# Patient Record
Sex: Male | Born: 1970 | Race: White | Hispanic: No | Marital: Married | Smoking: Former smoker
Health system: Southern US, Community
[De-identification: ages and names within clinical notes are randomized; demographics above are authoritative.]

## PROBLEM LIST (undated history)

## (undated) DIAGNOSIS — F419 Anxiety disorder, unspecified: Secondary | ICD-10-CM

## (undated) DIAGNOSIS — R51 Headache: Secondary | ICD-10-CM

## (undated) DIAGNOSIS — I10 Essential (primary) hypertension: Secondary | ICD-10-CM

---

## 2012-10-09 ENCOUNTER — Emergency Department (HOSPITAL_COMMUNITY): Payer: BC Managed Care – PPO

## 2012-10-09 ENCOUNTER — Encounter (HOSPITAL_COMMUNITY): Payer: Self-pay | Admitting: Cardiology

## 2012-10-09 ENCOUNTER — Emergency Department (HOSPITAL_COMMUNITY)
Admission: EM | Admit: 2012-10-09 | Discharge: 2012-10-09 | Disposition: A | Discharge status: Home / Self Care | Payer: BC Managed Care – PPO | Attending: Emergency Medicine | Admitting: Emergency Medicine

## 2012-10-09 DIAGNOSIS — E041 Nontoxic single thyroid nodule: Secondary | ICD-10-CM | POA: Insufficient documentation

## 2012-10-09 DIAGNOSIS — M545 Low back pain, unspecified: Secondary | ICD-10-CM | POA: Insufficient documentation

## 2012-10-09 DIAGNOSIS — F411 Generalized anxiety disorder: Secondary | ICD-10-CM | POA: Insufficient documentation

## 2012-10-09 LAB — CBC
Hemoglobin: 16.6 g/dL (ref 13.0–17.0)
MCHC: 36 g/dL (ref 30.0–36.0)
RBC: 5.18 MIL/uL (ref 4.22–5.81)
WBC: 15.9 10*3/uL — ABNORMAL HIGH (ref 4.0–10.5)

## 2012-10-09 LAB — BASIC METABOLIC PANEL
CO2: 25 mEq/L (ref 19–32)
Chloride: 102 mEq/L (ref 96–112)
Creatinine, Ser: 0.86 mg/dL (ref 0.50–1.35)
GFR calc non Af Amer: >90 mL/min (ref >90)
Glucose, Bld: 94 mg/dL (ref 70–99)
Potassium: 3.7 mEq/L (ref 3.5–5.1)
Sodium: 140 mEq/L (ref 135–145)

## 2012-10-09 MED ORDER — LORAZEPAM 2 MG/ML IJ SOLN
1.0000 mg | Freq: Once | INTRAMUSCULAR | Status: AC
  Administered 2012-10-09: 1 mg via INTRAVENOUS
  Filled 2012-10-09: qty 1

## 2012-10-09 NOTE — ED Notes (Signed)
Pt reports for the past couple of weeks he has noticed that if turns his head to the right his eyes twitch and he noticed some swelling in his neck on the right side. Also reports some numbness in the left foot. No other complaints. Pt A&Ox4.

## 2012-10-09 NOTE — ED Provider Notes (Signed)
CSN: [id number]     Arrival date & time 10/09/12  1535 History   First MD Initiated Contact with Patient 10/09/12 1708     Chief Complaint  Patient presents with  . Facial Swelling  . Back Pain   (Consider location/radiation/quality/duration/timing/severity/associated sxs/prior Treatment) The history is provided by the patient and the spouse.   42 year old male presents emergency department with multiple complaints.  Patient states that he has had tightness in his back, popping in his ears, and fleeting numbness of his left hand.  He also has some discomfort in his lower back. C/o twitching in his eyelid.  Patient is extremely anxious.  When probed further he also notes that he has a mass at the base of his neck and is concerned that he might have throat cancer.  He states that his grandmother died of throat cancer and he has previously been a long-term smoker however he quit smoking 3-4 years ago.  Patient states he has had difficulty sleeping and has had extreme anxiety. He denies any soaking night sweats, fevers, fatigue,weight loss or loss of appetite. Denies DOE, SOB, chest tightness or pressure, radiation to left arm, jaw or back, or diaphoresis. Denies dysuria, flank pain, suprapubic pain, frequency, urgency, or hematuria. Denies  light headedness, weakness, visual disturbances. Denies abdominal pain, nausea, vomiting, diarrhea or constipation.    History reviewed. No pertinent past medical history. History reviewed. No pertinent past surgical history. History reviewed. No pertinent family history. History  Substance Use Topics  . Smoking status: Never Smoker   . Smokeless tobacco: Not on file  . Alcohol Use: No    Review of Systems Ten systems reviewed and are negative for acute change, except as noted in the HPI.   Allergies  Review of patient's allergies indicates no known allergies.  Home Medications   Current Outpatient Rx  Name  Route  Sig  Dispense  Refill  .  ibuprofen (ADVIL,MOTRIN) 200 MG tablet   Oral   Take 400 mg by mouth every 6 (six) hours as needed for pain.          BP 137/77  Pulse 84  Temp(Src) 98.6 F (37 C) (Oral)  Resp 18  Ht 6' (1.829 m)  Wt 195 lb (88.451 kg)  BMI 26.44 kg/m2  SpO2 98% Physical Exam Physical Exam  Nursing note and vitals reviewed. Constitutional: He appears well-developed and well-nourished. No distress.  HENT:  Head: Normocephalic and atraumatic.  Eyes: Conjunctivae normal are normal. No scleral icterus.  Neck: Normal range of motion. Neck supple. soft mobile 3 cm non-tender nodule R inferior, anterior neck Cardiovascular: Normal rate, regular rhythm and normal heart sounds.   Pulmonary/Chest: Effort normal and breath sounds normal. No respiratory distress.  Abdominal: Soft. There is no tenderness.  Musculoskeletal: He exhibits no edema.  Neurological: He is alert.  Skin: Skin is warm and dry. He is not diaphoretic.  Psychiatric: His behavior is normal.    ED Course  Procedures (including critical care time) Labs Review Labs Reviewed - No data to display Imaging Review No results found.  MDM   1. Thyroid nodule    Patient sent for [place] which suggests thyroid nodule. I have advised patient that he will need follow up appointment for FNA as well as PCP follow up for thyroid function panel. If the patient has hyperthyroidism is could certainly account for many of his current sxs. Patient states his mother has a hx of thyroid cancer.  Follow up at ENT/PCP.  Patient does have leukocytosis, however he is afebrile and there is no other suggestion of infection. Possible a result of acute phase reaction as the patient is extremely anxious and tearful. Return precautions     Arthor Captain, PA-C 10/13/12 2237  Arthor Captain, PA-C 10/13/12 2240

## 2012-10-14 ENCOUNTER — Other Ambulatory Visit (HOSPITAL_COMMUNITY)
Admission: RE | Admit: 2012-10-14 | Discharge: 2012-10-14 | Disposition: A | Discharge status: Home / Self Care | Payer: BC Managed Care – PPO | Source: Ambulatory Visit | Attending: Otolaryngology | Admitting: Otolaryngology

## 2012-10-14 ENCOUNTER — Other Ambulatory Visit: Payer: Self-pay | Admitting: Otolaryngology

## 2012-10-14 DIAGNOSIS — E041 Nontoxic single thyroid nodule: Secondary | ICD-10-CM | POA: Insufficient documentation

## 2012-10-18 ENCOUNTER — Other Ambulatory Visit: Payer: Self-pay | Admitting: Otolaryngology

## 2012-10-18 DIAGNOSIS — D449 Neoplasm of uncertain behavior of unspecified endocrine gland: Secondary | ICD-10-CM

## 2012-10-21 NOTE — ED Provider Notes (Signed)
Medical screening examination/treatment/procedure(s) were performed by non-physician practitioner and as supervising physician I was immediately available for consultation/collaboration.  Raeford Razor, MD 10/21/12 [phone]

## 2012-10-23 ENCOUNTER — Ambulatory Visit
Admission: RE | Admit: 2012-10-23 | Discharge: 2012-10-23 | Disposition: A | Discharge status: Home / Self Care | Payer: BC Managed Care – PPO | Source: Ambulatory Visit | Attending: Otolaryngology | Admitting: Otolaryngology

## 2012-10-23 DIAGNOSIS — D449 Neoplasm of uncertain behavior of unspecified endocrine gland: Secondary | ICD-10-CM

## 2012-10-23 MED ORDER — IOHEXOL 300 MG/ML  SOLN
75.0000 mL | Freq: Once | INTRAMUSCULAR | Status: AC | PRN
  Administered 2012-10-23: 75 mL via INTRAVENOUS

## 2012-10-30 ENCOUNTER — Encounter (HOSPITAL_COMMUNITY): Payer: Self-pay | Admitting: Pharmacy Technician

## 2012-11-05 ENCOUNTER — Encounter (HOSPITAL_COMMUNITY)
Admission: RE | Admit: 2012-11-05 | Discharge: 2012-11-05 | Disposition: A | Discharge status: Home / Self Care | Payer: BC Managed Care – PPO | Source: Ambulatory Visit | Attending: Otolaryngology | Admitting: Otolaryngology

## 2012-11-05 ENCOUNTER — Ambulatory Visit (HOSPITAL_COMMUNITY)
Admission: RE | Admit: 2012-11-05 | Discharge: 2012-11-05 | Disposition: A | Discharge status: Home / Self Care | Payer: BC Managed Care – PPO | Source: Ambulatory Visit | Attending: Otolaryngology | Admitting: Otolaryngology

## 2012-11-05 ENCOUNTER — Encounter (HOSPITAL_COMMUNITY): Payer: Self-pay

## 2012-11-05 DIAGNOSIS — I10 Essential (primary) hypertension: Secondary | ICD-10-CM | POA: Insufficient documentation

## 2012-11-05 DIAGNOSIS — Z01812 Encounter for preprocedural laboratory examination: Secondary | ICD-10-CM | POA: Insufficient documentation

## 2012-11-05 DIAGNOSIS — Z01818 Encounter for other preprocedural examination: Secondary | ICD-10-CM | POA: Insufficient documentation

## 2012-11-05 DIAGNOSIS — Z01810 Encounter for preprocedural cardiovascular examination: Secondary | ICD-10-CM | POA: Insufficient documentation

## 2012-11-05 HISTORY — DX: Essential (primary) hypertension: I10

## 2012-11-05 HISTORY — DX: Headache: R51

## 2012-11-05 HISTORY — DX: Anxiety disorder, unspecified: F41.9

## 2012-11-05 LAB — BASIC METABOLIC PANEL
BUN: 17 mg/dL (ref 6–23)
CO2: 26 mEq/L (ref 19–32)
Calcium: 9.7 mg/dL (ref 8.4–10.5)
Chloride: 98 mEq/L (ref 96–112)
GFR calc Af Amer: >90 mL/min (ref >90)
Glucose, Bld: 145 mg/dL — ABNORMAL HIGH (ref 70–99)
Potassium: 4.4 mEq/L (ref 3.5–5.1)

## 2012-11-05 LAB — CBC
HCT: 46.5 % (ref 39.0–52.0)
Hemoglobin: 16.2 g/dL (ref 13.0–17.0)
MCH: 31.1 pg (ref 26.0–34.0)
MCHC: 34.8 g/dL (ref 30.0–36.0)
MCV: 89.3 fL (ref 78.0–100.0)
WBC: 11.2 10*3/uL — ABNORMAL HIGH (ref 4.0–10.5)

## 2012-11-05 NOTE — Pre-Procedure Instructions (Signed)
Alanzo Lamb  11/05/2012   Your procedure is scheduled on:  11/07/12  Report to [facility]  2 * 3 at 630 AM.  Call this number if you have problems the morning of surgery: [phone]   Remember:   Do not eat food or drink liquids after midnight.   Take these medicines the morning of surgery with A SIP OF WATER: lexapro   Do not wear jewelry, make-up or nail polish.  Do not wear lotions, powders, or perfumes. You may wear deodorant.  Do not shave 48 hours prior to surgery. Men may shave face and neck.  Do not bring valuables to the hospital.  [facility] is not responsible                  for any belongings or valuables.               Contacts, dentures or bridgework may not be worn into surgery.  Leave suitcase in the car. After surgery it may be brought to your room.  For patients admitted to the hospital, discharge time is determined by your                treatment team.               Patients discharged the day of surgery will not be allowed to drive  home.  Name and phone number of your driver: family  Special Instructions: Shower using CHG 2 nights before surgery and the night before surgery.  If you shower the day of surgery use CHG.  Use special wash - you have one bottle of CHG for all showers.  You should use approximately 1/3 of the bottle for each shower.   Please read over the following fact sheets that you were given: Pain Booklet, Coughing and Deep Breathing and Surgical Site Infection Prevention

## 2012-11-06 ENCOUNTER — Other Ambulatory Visit: Payer: Self-pay | Admitting: Otolaryngology

## 2012-11-06 ENCOUNTER — Inpatient Hospital Stay (HOSPITAL_COMMUNITY): Admission: RE | Admit: 2012-11-06 | Payer: BC Managed Care – PPO | Source: Ambulatory Visit

## 2012-11-06 NOTE — H&P (Signed)
Mom,  Evan Carter 42 y.o., male [id number]     Chief Complaint: New onset RIGHT lower neck mass  HPI: 42-year-old white male noticed a lump in his RIGHT lower neck roughly 2 weeks ago.  It seemed to come up rather suddenly, but no real symptoms.  He does have slight hoarseness and throat clearing.  He does not think he has allergies.  No history or symptoms of reflux.  He does not smoke.  He is breathing and swallowing comfortably.  No other lumps in his neck.  No history of cancer.  No family history of thyroid problems.  No personal history of radiation.  Preoperative visit.  He has a RIGHT thyroid lobe, needle aspirate showing follicular epithelium of uncertain significance.  CT scan shows an isolated nodule.  No lymph nodes.  He wonders if this lesion could have been giving him some mass effect in his neck for several years.   I talked through the therapeutic decision making beginning with thyroid lobectomy, frozen section, possible completion thyroidectomy either at that time or in stages in anticipation of use of radioactive iodine.  Details of the surgery including risks and complications were discussed.  Questions were answered and informed consent was obtained.  Prescriptions for hydrocodone and oxycodone written and given.  Return to work in one week, strenuous activities in 2 weeks, and here in our office in 2 weeks.  I recommended Hibiclens preoperative surgical scrub.  PMH: Past Medical History  Diagnosis Date  . Anxiety   . Hypertension     pcp   dr [place]   [facility]  . Headache(784.0)     Surg Hx: Past Surgical History  Procedure Laterality Date  . No past surgeries      FHx:  No family history on file. SocHx:  reports that he has never smoked. He does not have any smokeless tobacco history on file. He reports that he drinks alcohol. He reports that he does not use illicit drugs.  ALLERGIES: No Known Allergies   (Not in a hospital admission)  Results for orders  placed during the hospital encounter of 11/05/12 (from the past 48 hour(s))  BASIC METABOLIC PANEL     Status: Abnormal   Collection Time    11/05/12  3:06 PM      Result Value Range   Sodium 137  135 - 145 mEq/L   Potassium 4.4  3.5 - 5.1 mEq/L   Chloride 98  96 - 112 mEq/L   CO2 26  19 - 32 mEq/L   Glucose, Bld 145 (*) 70 - 99 mg/dL   BUN 17  6 - 23 mg/dL   Creatinine, Ser 0.94  0.50 - 1.35 mg/dL   Calcium 9.7  8.4 - 10.5 mg/dL   GFR calc non Af Amer >90  >90 mL/min   GFR calc Af Amer >90  >90 mL/min   Comment: (NOTE)     The eGFR has been calculated using the CKD EPI equation.     This calculation has not been validated in all clinical situations.     eGFR's persistently <90 mL/min signify possible Chronic Kidney     Disease.  CBC     Status: Abnormal   Collection Time    11/05/12  3:06 PM      Result Value Range   WBC 11.2 (*) 4.0 - 10.5 K/uL   RBC 5.21  4.22 - 5.81 MIL/uL   Hemoglobin 16.2  13.0 - 17.0 g/dL   HCT 46.5    39.0 - 52.0 %   MCV 89.3  78.0 - 100.0 fL   MCH 31.1  26.0 - 34.0 pg   MCHC 34.8  30.0 - 36.0 g/dL   RDW 12.4  11.5 - 15.5 %   Platelets 227  150 - 400 K/uL   Dg Chest 2 View  11/05/2012   CLINICAL DATA:  Hypertension.  EXAM: CHEST  2 VIEW  COMPARISON:  None.  FINDINGS: The heart size and mediastinal contours are within normal limits. Both lungs are clear. The visualized skeletal structures are unremarkable.  IMPRESSION: No active cardiopulmonary disease.   Electronically Signed   By: James  Green M.D.   On: 11/05/2012 15:52    ROS:Systemic: Feeling tired (fatigue).  No fever, no night sweats, and no recent weight loss. Head: No headache. Eyes: Eye symptoms. Otolaryngeal: No hearing loss, no earache, no tinnitus, and no purulent nasal discharge.  No nasal passage blockage (stuffiness), no snoring, and no sneezing.  Hoarseness.  No sore throat. Cardiovascular: No chest pain or discomfort  and no palpitations. Pulmonary: No dyspnea, no cough, and no  wheezing. Gastrointestinal: No dysphagia  and no heartburn.  No nausea, no abdominal pain, and no melena.  No diarrhea. Genitourinary: No dysuria. Endocrine: No muscle weakness. Musculoskeletal: No calf muscle cramps, no arthralgias, and no soft tissue swelling. Neurological: Dizziness.  No fainting.  Tingling  and numbness. Psychological: Anxiety.  No depression. Skin: No rash.  BP:134/72,  HR: 70 b/min,  Height: 72 in, Weight: 200 lb, BMI: 27.1 kg/m2  PHYSICAL EXAM: He is stocky and healthy.  Voice is mildly raspy.  The external nose is deviated LEFT.  Ears are clear.  Internal nose shows some septal corrugation but basically good airway.  Oral cavity is moist with teeth in good repair.  Oropharynx clear.  I could not examine the hypopharynx with a mirror.  Neck examination with some fullness in the RIGHT thyroid lobe.  No palpable adenopathy.  Negative Chvostek's sign   Using the flexible laryngoscope, both vocal cords are fully mobile and normal.   Lungs: Clear to auscultation Heart: Regular rate and rhythm without murmurs Abdomen: Soft, active Extremities: Normal configuration Neurologic: Symmetric, grossly intact.   Studies Reviewed:We received the needle aspiration cytology report from October 6.  This is a follicular lesion of undetermined significance.  There are some Hurthle cells including increased nuclear size and overlap.   I called and discussed this with his wife.  We need to get a CT scan of his neck with contrast.  He will need surgery to remove the lobe, and possibly the entire thyroid gland depending on the frozen section diagnosis.  he will call to discuss this with me when he returns from his trip.  I reviewed his CT scan of the neck from earlier today.  He has a RIGHT thyroid mass, 3.1 cm in greatest dimension.  No obvious lymphadenopathy.      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  Everything looks good today.   We are going to take out the RIGHT thyroid lobe next week, and then further surgery as necessary.  No strenuous activities for 2 weeks after surgery.  I will see you back here in the office 2 weeks after surgery.  You will stay overnight in the hospital one night with us.  I am leaving you prescriptions for stronger and weaker narcotic pain medication for after surgery.  We will not know until after your surgery whether you need to take   a thyroid replacement medication.    Oxycodone-Acetaminophen 5-325 MG Oral Tablet;TAKE 1 TO 2 TABLETS EVERY 4 TO 6 HOURS AS NEEDED FOR PAIN; Qty30; R0; Rx. Hydrocodone-Acetaminophen 5-325 MG Oral Tablet;1-2 po q4-6h prn pain; Qty50; R0; Rx.  Adrie Picking 11/06/2012, 12:00 PM     

## 2012-11-06 NOTE — Progress Notes (Signed)
Left voicemail for patient to arrive at 0600 in morning for surgery.

## 2012-11-07 ENCOUNTER — Encounter (HOSPITAL_COMMUNITY): Payer: Self-pay | Admitting: Anesthesiology

## 2012-11-07 ENCOUNTER — Observation Stay (HOSPITAL_COMMUNITY)
Admission: RE | Admit: 2012-11-07 | Discharge: 2012-11-08 | Disposition: A | Discharge status: Home / Self Care | Payer: BC Managed Care – PPO | Source: Ambulatory Visit | Attending: Otolaryngology | Admitting: Otolaryngology

## 2012-11-07 ENCOUNTER — Ambulatory Visit (HOSPITAL_COMMUNITY): Payer: BC Managed Care – PPO | Admitting: Anesthesiology

## 2012-11-07 ENCOUNTER — Encounter (HOSPITAL_COMMUNITY): Payer: BC Managed Care – PPO | Admitting: Anesthesiology

## 2012-11-07 ENCOUNTER — Encounter (HOSPITAL_COMMUNITY)
Admission: RE | Disposition: A | Discharge status: Home / Self Care | Payer: Self-pay | Source: Ambulatory Visit | Attending: Otolaryngology

## 2012-11-07 DIAGNOSIS — D497 Neoplasm of unspecified behavior of endocrine glands and other parts of nervous system: Secondary | ICD-10-CM | POA: Diagnosis present

## 2012-11-07 DIAGNOSIS — E063 Autoimmune thyroiditis: Principal | ICD-10-CM | POA: Insufficient documentation

## 2012-11-07 DIAGNOSIS — I10 Essential (primary) hypertension: Secondary | ICD-10-CM | POA: Insufficient documentation

## 2012-11-07 HISTORY — PX: THYROIDECTOMY: SHX17

## 2012-11-07 HISTORY — PX: THYROID LOBECTOMY: SHX420

## 2012-11-07 LAB — CBC
HCT: 46.6 % (ref 39.0–52.0)
MCH: 31 pg (ref 26.0–34.0)
MCHC: 33.9 g/dL (ref 30.0–36.0)
MCV: 91.6 fL (ref 78.0–100.0)
Platelets: 212 10*3/uL (ref 150–400)
RDW: 12.4 % (ref 11.5–15.5)
WBC: 16.6 10*3/uL — ABNORMAL HIGH (ref 4.0–10.5)

## 2012-11-07 LAB — HEPATIC FUNCTION PANEL
AST: 30 U/L (ref 0–37)
Albumin: 4.4 g/dL (ref 3.5–5.2)
Alkaline Phosphatase: 65 U/L (ref 39–117)
Total Bilirubin: 0.4 mg/dL (ref 0.3–1.2)

## 2012-11-07 LAB — CREATININE, SERUM: GFR calc Af Amer: >90 mL/min (ref >90)

## 2012-11-07 SURGERY — THYROIDECTOMY
Anesthesia: General | Site: Neck | Laterality: Right | Wound class: Clean

## 2012-11-07 MED ORDER — HEPARIN SODIUM (PORCINE) 5000 UNIT/ML IJ SOLN
5000.0000 [IU] | Freq: Three times a day (TID) | INTRAMUSCULAR | Status: DC
  Administered 2012-11-07 – 2012-11-08 (×2): 5000 [IU] via SUBCUTANEOUS
  Filled 2012-11-07 (×4): qty 1

## 2012-11-07 MED ORDER — HYDROMORPHONE HCL PF 1 MG/ML IJ SOLN
INTRAMUSCULAR | Status: AC
  Filled 2012-11-07: qty 1

## 2012-11-07 MED ORDER — GLYCOPYRROLATE 0.2 MG/ML IJ SOLN
INTRAMUSCULAR | Status: DC | PRN
  Administered 2012-11-07: .8 mg via INTRAVENOUS

## 2012-11-07 MED ORDER — ONDANSETRON HCL 4 MG/2ML IJ SOLN: 4.0000 mg | INTRAMUSCULAR | Status: DC | PRN

## 2012-11-07 MED ORDER — ONDANSETRON HCL 4 MG/2ML IJ SOLN
INTRAMUSCULAR | Status: DC | PRN
  Administered 2012-11-07 (×2): 4 mg via INTRAVENOUS

## 2012-11-07 MED ORDER — PROPOFOL 10 MG/ML IV BOLUS
INTRAVENOUS | Status: DC | PRN
  Administered 2012-11-07: 200 mg via INTRAVENOUS

## 2012-11-07 MED ORDER — HYDROMORPHONE HCL PF 1 MG/ML IJ SOLN
0.2500 mg | INTRAMUSCULAR | Status: DC | PRN
  Administered 2012-11-07 (×3): 0.5 mg via INTRAVENOUS

## 2012-11-07 MED ORDER — OXYCODONE HCL 5 MG/5ML PO SOLN: 5.0000 mg | Freq: Once | ORAL | Status: DC | PRN

## 2012-11-07 MED ORDER — MORPHINE SULFATE 2 MG/ML IJ SOLN
1.0000 mg | INTRAMUSCULAR | Status: DC | PRN
  Administered 2012-11-07: 2 mg via INTRAVENOUS
  Filled 2012-11-07: qty 1

## 2012-11-07 MED ORDER — ESCITALOPRAM OXALATE 10 MG PO TABS
10.0000 mg | ORAL_TABLET | Freq: Every day | ORAL | Status: DC
  Administered 2012-11-07 – 2012-11-08 (×2): 10 mg via ORAL
  Filled 2012-11-07 (×2): qty 1

## 2012-11-07 MED ORDER — ARTIFICIAL TEARS OP OINT
TOPICAL_OINTMENT | OPHTHALMIC | Status: DC | PRN
  Administered 2012-11-07: 1 via OPHTHALMIC

## 2012-11-07 MED ORDER — NEOSTIGMINE METHYLSULFATE 1 MG/ML IJ SOLN
INTRAMUSCULAR | Status: DC | PRN
  Administered 2012-11-07: 5 mg via INTRAVENOUS

## 2012-11-07 MED ORDER — CHLORHEXIDINE GLUCONATE 4 % EX LIQD: 1.0000 "application " | Freq: Once | CUTANEOUS | Status: DC

## 2012-11-07 MED ORDER — LACTATED RINGERS IV SOLN
INTRAVENOUS | Status: DC | PRN
  Administered 2012-11-07 (×3): via INTRAVENOUS

## 2012-11-07 MED ORDER — OXYCODONE HCL 5 MG PO TABS: 5.0000 mg | ORAL_TABLET | Freq: Once | ORAL | Status: DC | PRN

## 2012-11-07 MED ORDER — ONDANSETRON HCL 4 MG/2ML IJ SOLN: 4.0000 mg | Freq: Once | INTRAMUSCULAR | Status: DC | PRN

## 2012-11-07 MED ORDER — HYDROCHLOROTHIAZIDE 12.5 MG PO CAPS
12.5000 mg | ORAL_CAPSULE | Freq: Every day | ORAL | Status: DC
  Administered 2012-11-07 – 2012-11-08 (×2): 12.5 mg via ORAL
  Filled 2012-11-07 (×2): qty 1

## 2012-11-07 MED ORDER — DEXAMETHASONE SODIUM PHOSPHATE 10 MG/ML IJ SOLN
INTRAMUSCULAR | Status: DC | PRN
  Administered 2012-11-07: 8 mg via INTRAVENOUS

## 2012-11-07 MED ORDER — LIDOCAINE-EPINEPHRINE 1 %-1:100000 IJ SOLN
INTRAMUSCULAR | Status: DC | PRN
  Administered 2012-11-07: 7 mL

## 2012-11-07 MED ORDER — BACITRACIN ZINC 500 UNIT/GM EX OINT
TOPICAL_OINTMENT | CUTANEOUS | Status: DC | PRN
  Administered 2012-11-07: 1 via TOPICAL

## 2012-11-07 MED ORDER — OXYCODONE-ACETAMINOPHEN 5-325 MG PO TABS
1.0000 | ORAL_TABLET | ORAL | Status: DC | PRN
  Administered 2012-11-07 – 2012-11-08 (×3): 2 via ORAL
  Filled 2012-11-07 (×3): qty 2

## 2012-11-07 MED ORDER — MIDAZOLAM HCL 5 MG/5ML IJ SOLN
INTRAMUSCULAR | Status: DC | PRN
  Administered 2012-11-07: 2 mg via INTRAVENOUS

## 2012-11-07 MED ORDER — MEPERIDINE HCL 25 MG/ML IJ SOLN: 6.2500 mg | INTRAMUSCULAR | Status: DC | PRN

## 2012-11-07 MED ORDER — BACITRACIN ZINC 500 UNIT/GM EX OINT
TOPICAL_OINTMENT | CUTANEOUS | Status: AC
  Filled 2012-11-07: qty 15

## 2012-11-07 MED ORDER — BACITRACIN ZINC 500 UNIT/GM EX OINT
1.0000 "application " | TOPICAL_OINTMENT | Freq: Three times a day (TID) | CUTANEOUS | Status: DC
  Administered 2012-11-07 – 2012-11-08 (×2): 1 via TOPICAL
  Filled 2012-11-07: qty 28.35

## 2012-11-07 MED ORDER — LIDOCAINE HCL (CARDIAC) 20 MG/ML IV SOLN
INTRAVENOUS | Status: DC | PRN
  Administered 2012-11-07: 100 mg via INTRAVENOUS

## 2012-11-07 MED ORDER — 0.9 % SODIUM CHLORIDE (POUR BTL) OPTIME
TOPICAL | Status: DC | PRN
  Administered 2012-11-07: 1000 mL

## 2012-11-07 MED ORDER — LIDOCAINE-EPINEPHRINE 1 %-1:100000 IJ SOLN
INTRAMUSCULAR | Status: AC
  Filled 2012-11-07: qty 1

## 2012-11-07 MED ORDER — ONDANSETRON HCL 4 MG PO TABS: 4.0000 mg | ORAL_TABLET | ORAL | Status: DC | PRN

## 2012-11-07 MED ORDER — DEXTROSE-NACL 5-0.45 % IV SOLN
INTRAVENOUS | Status: DC
  Administered 2012-11-07: 16:00:00 via INTRAVENOUS

## 2012-11-07 MED ORDER — INFLUENZA VAC SPLIT QUAD 0.5 ML IM SUSP
0.5000 mL | INTRAMUSCULAR | Status: DC
Start: 2012-11-08 — End: 2012-11-08
  Filled 2012-11-07: qty 0.5

## 2012-11-07 MED ORDER — ROCURONIUM BROMIDE 100 MG/10ML IV SOLN
INTRAVENOUS | Status: DC | PRN
  Administered 2012-11-07: 20 mg via INTRAVENOUS
  Administered 2012-11-07: 50 mg via INTRAVENOUS

## 2012-11-07 MED ORDER — FENTANYL CITRATE 0.05 MG/ML IJ SOLN
INTRAMUSCULAR | Status: DC | PRN
  Administered 2012-11-07 (×2): 100 ug via INTRAVENOUS
  Administered 2012-11-07: 50 ug via INTRAVENOUS

## 2012-11-07 MED ORDER — LACTATED RINGERS IV SOLN
INTRAVENOUS | Status: DC
  Administered 2012-11-07: 07:00:00 via INTRAVENOUS

## 2012-11-07 MED ORDER — HYDROCODONE-ACETAMINOPHEN 5-325 MG PO TABS
1.0000 | ORAL_TABLET | ORAL | Status: DC | PRN
  Administered 2012-11-08: 2 via ORAL
  Filled 2012-11-07: qty 2

## 2012-11-07 SURGICAL SUPPLY — 45 items
APPLIER CLIP 9.375 SM OPEN (CLIP) ×4
ATTRACTOMAT 16X20 MAGNETIC DRP (DRAPES) IMPLANT
BLADE SURG 15 STRL LF DISP TIS (BLADE) IMPLANT
BLADE SURG 15 STRL SS (BLADE)
BLADE SURG ROTATE 9660 (MISCELLANEOUS) IMPLANT
CANISTER SUCTION 2500CC (MISCELLANEOUS) ×2 IMPLANT
CLEANER TIP ELECTROSURG 2X2 (MISCELLANEOUS) ×2 IMPLANT
CLIP APPLIE 9.375 SM OPEN (CLIP) ×2 IMPLANT
CLOTH BEACON ORANGE TIMEOUT ST (SAFETY) ×2 IMPLANT
CONT SPEC 4OZ CLIKSEAL STRL BL (MISCELLANEOUS) IMPLANT
CORDS BIPOLAR (ELECTRODE) IMPLANT
COVER SURGICAL LIGHT HANDLE (MISCELLANEOUS) ×2 IMPLANT
CRADLE DONUT ADULT HEAD (MISCELLANEOUS) IMPLANT
DRAIN SNY 10 ROU (WOUND CARE) IMPLANT
DRAIN WOUND SNY 15 RND (WOUND CARE) ×2 IMPLANT
ELECT COATED BLADE 2.86 ST (ELECTRODE) ×2 IMPLANT
ELECT REM PT RETURN 9FT ADLT (ELECTROSURGICAL) ×2
ELECTRODE REM PT RTRN 9FT ADLT (ELECTROSURGICAL) ×1 IMPLANT
EVACUATOR SILICONE 100CC (DRAIN) ×2 IMPLANT
GAUZE SPONGE 4X4 16PLY XRAY LF (GAUZE/BANDAGES/DRESSINGS) ×2 IMPLANT
GLOVE ECLIPSE 8.0 STRL XLNG CF (GLOVE) ×2 IMPLANT
GOWN PREVENTION PLUS XLARGE (GOWN DISPOSABLE) ×2 IMPLANT
GOWN STRL NON-REIN LRG LVL3 (GOWN DISPOSABLE) ×4 IMPLANT
KIT BASIN OR (CUSTOM PROCEDURE TRAY) ×2 IMPLANT
KIT ROOM TURNOVER OR (KITS) ×2 IMPLANT
LOCATOR NERVE 3 VOLT (DISPOSABLE) IMPLANT
NS IRRIG 1000ML POUR BTL (IV SOLUTION) ×2 IMPLANT
PAD ARMBOARD 7.5X6 YLW CONV (MISCELLANEOUS) ×4 IMPLANT
PENCIL BUTTON HOLSTER BLD 10FT (ELECTRODE) ×2 IMPLANT
SCRUB FOAM CHG 2% SURGICAL (MISCELLANEOUS) IMPLANT
SPECIMEN JAR SMALL (MISCELLANEOUS) ×2 IMPLANT
SPONGE INTESTINAL PEANUT (DISPOSABLE) IMPLANT
STAPLER VISISTAT 35W (STAPLE) ×2 IMPLANT
STRIP CLOSURE SKIN 1/2X4 (GAUZE/BANDAGES/DRESSINGS) IMPLANT
SUT CHROMIC 3 0 PS 2 (SUTURE) IMPLANT
SUT CHROMIC 4 0 PS 2 18 (SUTURE) ×4 IMPLANT
SUT ETHILON 3 0 PS 1 (SUTURE) ×2 IMPLANT
SUT ETHILON 5 0 PS 2 18 (SUTURE) ×2 IMPLANT
SUT SILK 2 0 SH CR/8 (SUTURE) ×2 IMPLANT
SUT SILK 3 0 REEL (SUTURE) IMPLANT
TOWEL OR 17X24 6PK STRL BLUE (TOWEL DISPOSABLE) ×2 IMPLANT
TOWEL OR 17X26 10 PK STRL BLUE (TOWEL DISPOSABLE) ×2 IMPLANT
TRAY ENT MC OR (CUSTOM PROCEDURE TRAY) ×2 IMPLANT
TRAY FOLEY CATH 14FRSI W/METER (CATHETERS) IMPLANT
WATER STERILE IRR 1000ML POUR (IV SOLUTION) ×2 IMPLANT

## 2012-11-07 NOTE — H&P (View-Only) (Signed)
Evan Carter, Evan Carter 42 y.o., male [id number]     Chief Complaint: New onset RIGHT lower neck mass  HPI: 42 year old white male noticed a lump in his RIGHT lower neck roughly 2 weeks ago.  It seemed to come up rather suddenly, but no real symptoms.  He does have slight hoarseness and throat clearing.  He does not think he has allergies.  No history or symptoms of reflux.  He does not smoke.  He is breathing and swallowing comfortably.  No other lumps in his neck.  No history of cancer.  No family history of thyroid problems.  No personal history of radiation.  Preoperative visit.  He has a RIGHT thyroid lobe, needle aspirate showing follicular epithelium of uncertain significance.  CT scan shows an isolated nodule.  No lymph nodes.  He wonders if this lesion could have been giving him some mass effect in his neck for several years.   I talked through the therapeutic decision making beginning with thyroid lobectomy, frozen section, possible completion thyroidectomy either at that time or in stages in anticipation of use of radioactive iodine.  Details of the surgery including risks and complications were discussed.  Questions were answered and informed consent was obtained.  Prescriptions for hydrocodone and oxycodone written and given.  Return to work in one week, strenuous activities in 2 weeks, and here in our office in 2 weeks.  I recommended Hibiclens preoperative surgical scrub.  PMH: Past Medical History  Diagnosis Date  . Anxiety   . Hypertension     pcp   dr Evan Carter   [facility]  . Headache(784.0)     Surg Hx: Past Surgical History  Procedure Laterality Date  . No past surgeries      FHx:  No family history on file. SocHx:  reports that he has never smoked. He does not have any smokeless tobacco history on file. He reports that he drinks alcohol. He reports that he does not use illicit drugs.  ALLERGIES: No Known Allergies   (Not in a hospital admission)  Results for orders  placed during the hospital encounter of 11/05/12 (from the past 48 hour(s))  BASIC METABOLIC PANEL     Status: Abnormal   Collection Time    11/05/12  3:06 PM      Result Value Range   Sodium 137  135 - 145 mEq/L   Potassium 4.4  3.5 - 5.1 mEq/L   Chloride 98  96 - 112 mEq/L   CO2 26  19 - 32 mEq/L   Glucose, Bld 145 (*) 70 - 99 mg/dL   BUN 17  6 - 23 mg/dL   Creatinine, Ser 4.09  0.50 - 1.35 mg/dL   Calcium 9.7  8.4 - 81.1 mg/dL   GFR calc non Af Amer >90  >90 mL/min   GFR calc Af Amer >90  >90 mL/min   Comment: (NOTE)     The eGFR has been calculated using the CKD EPI equation.     This calculation has not been validated in all clinical situations.     eGFR's persistently <90 mL/min signify possible Chronic Kidney     Disease.  CBC     Status: Abnormal   Collection Time    11/05/12  3:06 PM      Result Value Range   WBC 11.2 (*) 4.0 - 10.5 K/uL   RBC 5.21  4.22 - 5.81 MIL/uL   Hemoglobin 16.2  13.0 - 17.0 g/dL   HCT 46.5  39.0 - 52.0 %   MCV 89.3  78.0 - 100.0 fL   MCH 31.1  26.0 - 34.0 pg   MCHC 34.8  30.0 - 36.0 g/dL   RDW 16.1  09.6 - 04.5 %   Platelets 227  150 - 400 K/uL   Dg Chest 2 View  11/05/2012   CLINICAL DATA:  Hypertension.  EXAM: CHEST  2 VIEW  COMPARISON:  None.  FINDINGS: The heart size and mediastinal contours are within normal limits. Both lungs are clear. The visualized skeletal structures are unremarkable.  IMPRESSION: No active cardiopulmonary disease.   Electronically Signed   By: Roque Lias M.D.   On: 11/05/2012 15:52    WUJ:WJXBJYNW: Feeling tired (fatigue).  No fever, no night sweats, and no recent weight loss. Head: No headache. Eyes: Eye symptoms. Otolaryngeal: No hearing loss, no earache, no tinnitus, and no purulent nasal discharge.  No nasal passage blockage (stuffiness), no snoring, and no sneezing.  Hoarseness.  No sore throat. Cardiovascular: No chest pain or discomfort  and no palpitations. Pulmonary: No dyspnea, no cough, and no  wheezing. Gastrointestinal: No dysphagia  and no heartburn.  No nausea, no abdominal pain, and no melena.  No diarrhea. Genitourinary: No dysuria. Endocrine: No muscle weakness. Musculoskeletal: No calf muscle cramps, no arthralgias, and no soft tissue swelling. Neurological: Dizziness.  No fainting.  Tingling  and numbness. Psychological: Anxiety.  No depression. Skin: No rash.  BP:134/72,  HR: 70 b/min,  Height: 72 in, Weight: 200 lb, BMI: 27.1 kg/m2  PHYSICAL EXAM: He is stocky and healthy.  Voice is mildly raspy.  The external nose is deviated LEFT.  Ears are clear.  Internal nose shows some septal corrugation but basically good airway.  Oral cavity is moist with teeth in good repair.  Oropharynx clear.  I could not examine the hypopharynx with a mirror.  Neck examination with some fullness in the RIGHT thyroid lobe.  No palpable adenopathy.  Negative Chvostek's sign   Using the flexible laryngoscope, both vocal cords are fully mobile and normal.   Lungs: Clear to auscultation Heart: Regular rate and rhythm without murmurs Abdomen: Soft, active Extremities: Normal configuration Neurologic: Symmetric, grossly intact.   Studies Reviewed:We received the needle aspiration cytology report from October 6.  This is a follicular lesion of undetermined significance.  There are some Hurthle cells including increased nuclear size and overlap.   I called and discussed this with his wife.  We need to get a CT scan of his neck with contrast.  He will need surgery to remove the lobe, and possibly the entire thyroid gland depending on the frozen section diagnosis.  he will call to discuss this with me when he returns from his trip.  I reviewed his CT scan of the neck from earlier today.  He has a RIGHT thyroid mass, 3.1 cm in greatest dimension.  No obvious lymphadenopathy.      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  Everything looks good today.   We are going to take out the RIGHT thyroid lobe next week, and then further surgery as necessary.  No strenuous activities for 2 weeks after surgery.  I will see you back here in the office 2 weeks after surgery.  You will stay overnight in the hospital one night with [place].  I am leaving you prescriptions for stronger and weaker narcotic pain medication for after surgery.  We will not know until after your surgery whether you need to take  a thyroid replacement medication.    Oxycodone-Acetaminophen 5-325 MG Oral Tablet;TAKE 1 TO 2 TABLETS EVERY 4 TO 6 HOURS AS NEEDED FOR PAIN; Qty30; R0; Rx. Hydrocodone-Acetaminophen 5-325 MG Oral Tablet;1-2 po q4-6h prn pain; Qty50; R0; Rx.  Lazarus Salines, Jonthan Leite 11/06/2012, 12:00 PM

## 2012-11-07 NOTE — Op Note (Signed)
11/07/2012  11:42 AM    Annamarie Dawley  [id number]   Pre-Op Dx:  Uncertain behaving neoplasm, right thyroid  Post-op Dx: Same  Proc: Right thyroid lobectomy   Surg:  Flo Shanks T MD  Ass't:  Clovis Cao PA  Anes:  GOT  EBL:  Minimal  Comp:  None  Findings:  A soft roughly 3 cm mass in the right thyroid lobe. No palpable masses in the left thyroid. No adenopathy in the low anterior neck. Right recurrent laryngeal nerve identified and protected.  Procedure: With the patient in a comfortable supine position, general orotracheal anesthesia was induced without difficulty. At an appropriate level, the patient was placed in a semisitting position. A shoulder roll was placed and the neck was supported. The lower neck was palpated with the findings as described above. 1% Xylocaine with 1 100,000 epinephrine, 7 cc total was infiltrated into the proposed surgical site. Several minutes were allowed for this to take effect.  A sterile preparation and draping of the low neck was accomplished and 5 minutes were allowed for the alcohol to evaporate completely.     An 8 cm transverse incision was marked and then sharply executed. This was carried through skin, subcutaneous fat, and platysma. Subplatysmal flaps were raised superiorly and inferiorly. Branches of the anterior jugular vein were controlled with silk ligature. The [place]ner retractor was placed and expanded.  The fascia was divided in the midline. This was carried down to the thyroid gland. Strap muscles were elevated off of the right thyroid lobe in 3 layers. The isthmus was identified and isolated between hemostats. It was divided, and the left side was controlled with a 2-0 silk suture ligature.  Working on the capsule of the gland, the superior pole of the thyroid was developed. Crossing vessels were controlled with titanium clips or with silk ligature. Muscles were taken off the anterior face of the thyroid. Similar work was done  around the inferior pole the thyroid. After freeing the superior pole, the dissection was carried under the superior pole down towards the cricoid cartilage. The thyroid gland was rolled towards the midline. Staying on the capsule of the gland,. Vessels were identified and controlled. With some difficulty owing to its deep location, the recurrent nerve was identified and protected. No cautery was performed in the vicinity of the nerve. With the nerve identified, the thyroid was dissected off of the trachea sharply and bluntly. Finally, the right lobe was delivered. This was sent for frozen section interpretation.  The wound was irrigated. Several areas were controlled with bipolar cautery away from the nerve. Hemostasis was observed including with Valsalva.  A 15 French perforated round drain was placed into the right thyroid bed and brought out in the midline. Strap muscles were reapproximated with interrupted 4-0 chromic suture. The [place] retractor was removed and the flaps were laid back down. The platysma layer was reapproximated with 4-0 chromic suture. The drain was secured to the external skin with 3-0 nylon. Finally the wound was closed in a cosmetic fashion with a running simple 5-0 Ethilon. The drain was functional. Bacitracin ointment was applied.   Patient was returned anesthesia, awakened, extubated, and transferred to recovery in stable condition.  Dispo:   PACU to 23 hour observation  Plan:  Ice, elevation, analgesia, suction drainage.  Await permanent pathologic interpretation.  Cephus Richer MD

## 2012-11-07 NOTE — Progress Notes (Signed)
Post op -   Complains of minor pain, swallowing and breathing well. Voice strong. Incision is clean, dry and intact. JP functioning on wall suction.  No swelling.  Stable post op. Continue overnight care.

## 2012-11-07 NOTE — Transfer of Care (Signed)
Immediate Anesthesia Transfer of Care Note  Patient: Evan Carter  Procedure(s) Performed: Procedure(s): RIGHT THYROID LOBECTOMY WITH FROZEN SECTION (Right)  Patient Location: PACU  Anesthesia Type:General  Level of Consciousness: awake  Airway & Oxygen Therapy: Patient Spontanous Breathing  Post-op Assessment: Report given to PACU RN, Post -op Vital signs reviewed and stable and Patient moving all extremities X 4  Post vital signs: Reviewed and stable  Complications: No apparent anesthesia complications

## 2012-11-07 NOTE — Anesthesia Procedure Notes (Signed)
Procedure Name: Intubation Date/Time: 11/07/2012 8:46 AM Performed by: Sherie Don Pre-anesthesia Checklist: Patient identified, Emergency Drugs available, Suction available, Patient being monitored and Timeout performed Patient Re-evaluated:Patient Re-evaluated prior to inductionOxygen Delivery Method: Circle system utilized Preoxygenation: Pre-oxygenation with 100% oxygen Intubation Type: IV induction Laryngoscope Size: Mac and 4 Grade View: Grade II Tube type: Oral Tube size: 8.0 mm Number of attempts: 1 Airway Equipment and Method: Stylet Placement Confirmation: ETT inserted through vocal cords under direct vision,  positive ETCO2 and breath sounds checked- equal and bilateral Secured at: 23 cm Tube secured with: Tape Dental Injury: Teeth and Oropharynx as per pre-operative assessment

## 2012-11-07 NOTE — Anesthesia Preprocedure Evaluation (Signed)
Anesthesia Evaluation  Patient identified by MRN, date of birth, ID band Patient awake    Reviewed: Allergy & Precautions, H&P , NPO status , Patient's Chart, lab work & pertinent test results  Airway Mallampati: I TM Distance: >3 FB Neck ROM: Full    Dental   Pulmonary          Cardiovascular hypertension, Pt. on medications     Neuro/Psych    GI/Hepatic   Endo/Other    Renal/GU      Musculoskeletal   Abdominal   Peds  Hematology   Anesthesia Other Findings   Reproductive/Obstetrics                           Anesthesia Physical Anesthesia Plan  ASA: II  Anesthesia Plan: General   Post-op Pain Management:    Induction: Intravenous  Airway Management Planned: Oral ETT  Additional Equipment:   Intra-op Plan:   Post-operative Plan: Extubation in OR  Informed Consent: I have reviewed the patients History and Physical, chart, labs and discussed the procedure including the risks, benefits and alternatives for the proposed anesthesia with the patient or authorized representative who has indicated his/her understanding and acceptance.     Plan Discussed with: CRNA and Surgeon  Anesthesia Plan Comments:         Anesthesia Quick Evaluation  

## 2012-11-07 NOTE — Preoperative (Signed)
Beta Blockers   Reason not to administer Beta Blockers:Not Applicable 

## 2012-11-07 NOTE — Interval H&P Note (Signed)
History and Physical Interval Note:  11/07/2012 8:15 AM  Evan Carter  has presented today for surgery, with the diagnosis of RIGHT THYROID MASS   The various methods of treatment have been discussed with the patient and family. After consideration of risks, benefits and other options for treatment, the patient has consented to  Procedure(s): RIGHT THYROID LOBECTOMY WITH FROZEN SECTION POSSIBLE TOTAL THYROIDECTOMY POSSIBLE ANTERIOR COMPARTMENTAL DISSECTION  (Right) as a surgical intervention .  The patient's history has been re-reviewed, patient re-examined, no change in status, stable for surgery.  I have re-reviewed the patient's chart and labs.  Questions were answered to the patient's satisfaction.     Flo Shanks

## 2012-11-07 NOTE — Anesthesia Postprocedure Evaluation (Signed)
Anesthesia Post Note  Patient: Evan Carter  Procedure(s) Performed: Procedure(s) (LRB): RIGHT THYROID LOBECTOMY WITH FROZEN SECTION (Right)  Anesthesia type: general  Patient location: PACU  Post pain: Pain level controlled  Post assessment: Patient's Cardiovascular Status Stable  Last Vitals:  Filed Vitals:   11/07/12 1245  BP:   Pulse: 74  Temp:   Resp: 15    Post vital signs: Reviewed and stable  Level of consciousness: sedated  Complications: No apparent anesthesia complications

## 2012-11-08 ENCOUNTER — Encounter (HOSPITAL_COMMUNITY): Payer: Self-pay | Admitting: Otolaryngology

## 2012-11-08 DIAGNOSIS — D497 Neoplasm of unspecified behavior of endocrine glands and other parts of nervous system: Secondary | ICD-10-CM | POA: Diagnosis present

## 2012-11-08 NOTE — Progress Notes (Signed)
11/08/2012 8:52 AM  Evan Carter [id number]  Post-Op Day 1    Temp:  [97.8 F (36.6 C)-98.7 F (37.1 C)] 97.9 F (36.6 C) (10/31 0603) Pulse Rate:  [65-90] 75 (10/31 0603) Resp:  [12-20] 18 (10/31 0603) BP: (126-145)/(77-91) 135/88 mmHg (10/31 0603) SpO2:  [92 %-100 %] 98 % (10/31 0603) Weight:  [92.987 kg (205 lb)] 92.987 kg (205 lb) (10/30 2125),     Intake/Output Summary (Last 24 hours) at 11/08/12 0852 Last data filed at 11/08/12 0608  Gross per 24 hour  Intake   4330 ml  Output   1685 ml  Net   2645 ml   Drain 35 ml  Results for orders placed during the hospital encounter of 11/07/12 (from the past 24 hour(s))  CBC     Status: Abnormal   Collection Time    11/07/12  1:03 PM      Result Value Range   WBC 16.6 (*) 4.0 - 10.5 K/uL   RBC 5.09  4.22 - 5.81 MIL/uL   Hemoglobin 15.8  13.0 - 17.0 g/dL   HCT 40.9  81.1 - 91.4 %   MCV 91.6  78.0 - 100.0 fL   MCH 31.0  26.0 - 34.0 pg   MCHC 33.9  30.0 - 36.0 g/dL   RDW 78.2  95.6 - 21.3 %   Platelets 212  150 - 400 K/uL  CREATININE, SERUM     Status: None   Collection Time    11/07/12  1:03 PM      Result Value Range   Creatinine, Ser 0.86  0.50 - 1.35 mg/dL   GFR calc non Af Amer >90  >90 mL/min   GFR calc Af Amer >90  >90 mL/min    SUBJECTIVE:  Sl pain with neck motion and swallow.  No breathing diff.  Spont void.  No chest pain.  OBJECTIVE:  Wound flat.  Drain d/c'd.  Voice strong.  IMPRESSION:  Satisfactory check  PLAN:  D/c home.  Wound hygiene.  Limited activity x 2 weeks.  Sutures out 7 days.  Await final pathology report.  Admit:30 OCT Discharge: 31 OCT Final Diagnosis:  Follicular neoplasm, RIGHT thyroid Procedure:  RIGHT thyroid lobectomy, 30 OCT Comp:  None Cond:  Ambulatory, pain controlled.  Breathing well.  Taking good po.  Spont void.  Drain out. Rx's:  Oxycodone, Hydrocodone R/v:  1 week my office Instructions written and given  Hosp Course:  Underwent RIGHT thyroid lobectomy on day of  admission.  Frozen section showed follicular neoplasm.  Observed overnight.  Minimal wound drainage.  Pain controlled.  Voiding well.  On POD1 drain removed without difficulty.  Patient discharged to home and care of family.  Flo Shanks

## 2012-11-08 NOTE — Progress Notes (Signed)
Discharge note. Pt's discharge instructions, Rx's, follow up, and care of surgical area reviewed with pt and pt's wife at the bedside. Pt reported that he feels like the pain medication does help some but is ready to feel better. Pt refused the flu vaccine and stated that he had a lot done while here and doesn't want another stick at this time. Reported he would get it at a later date. Pt is ready for discharge.

## 2014-02-11 IMAGING — US US SOFT TISSUE HEAD/NECK
1 series · 14 of 22 positions shown · non-contrast
Comparison: None.

CLINICAL DATA: Neck mass, initial encounter.

THYROID ULTRASOUND
TECHNIQUE: Ultrasound examination of the thyroid gland and adjacent
soft tissues was performed.

[Series 1: us soft tissue head/neck · 0.06mm/px · 22 acquisitions, 14 frames shown]
[im 1/22]
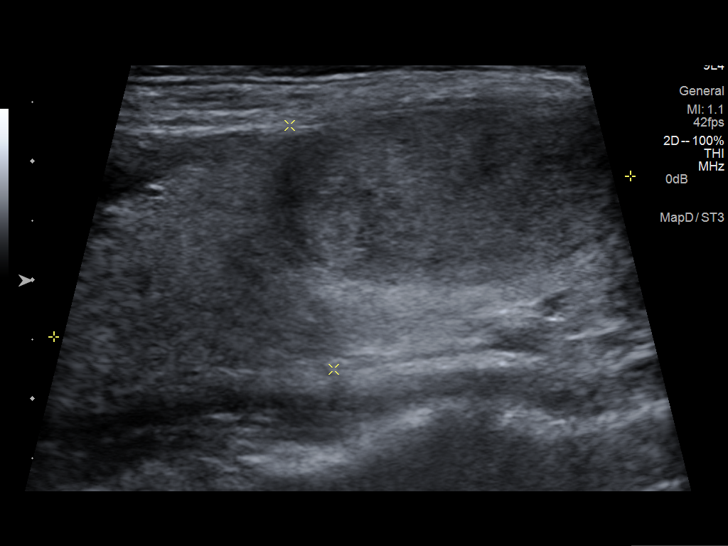
[im 3/22]
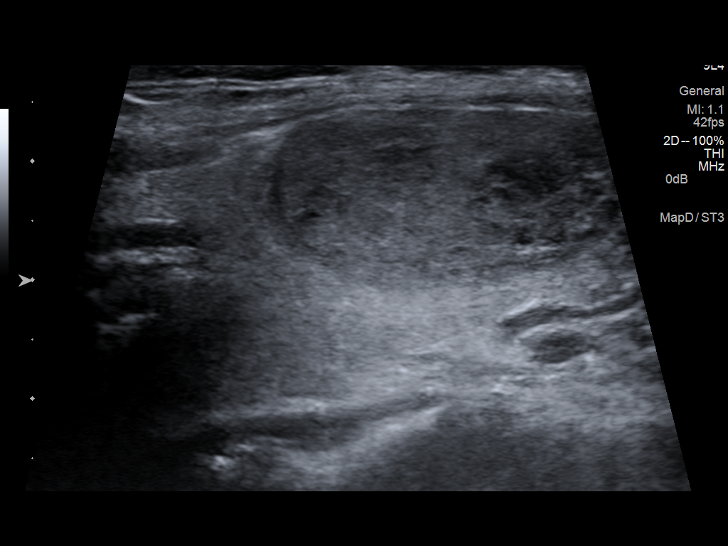
[im 4/22]
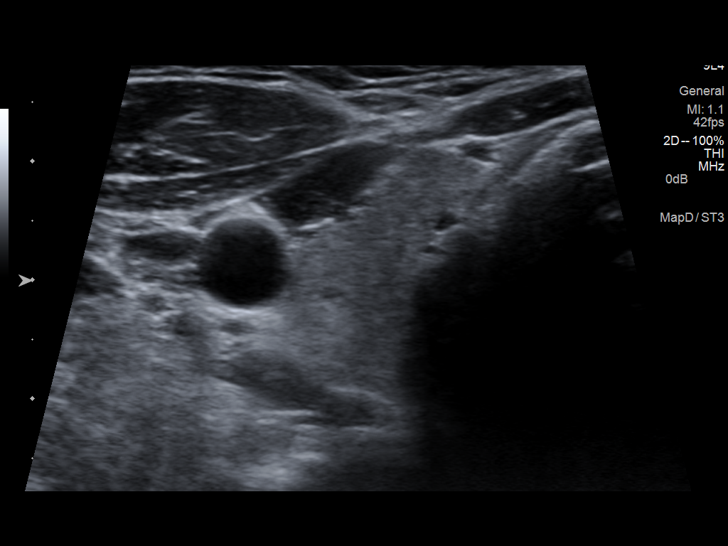
[im 6/22]
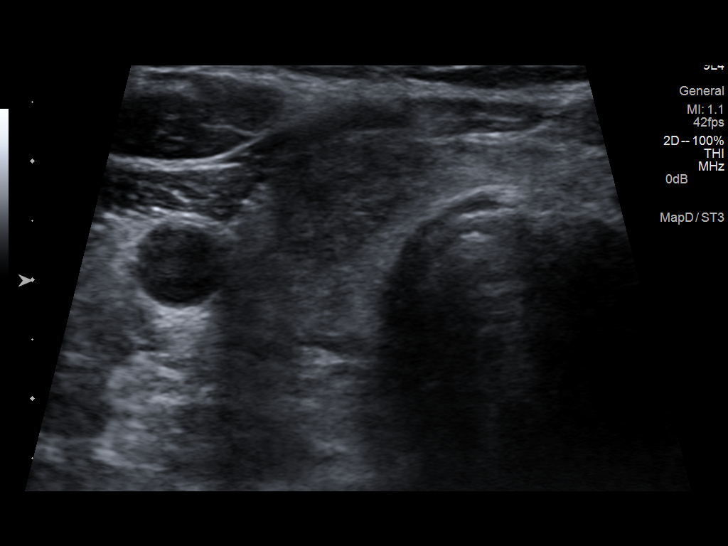
[im 8/22]
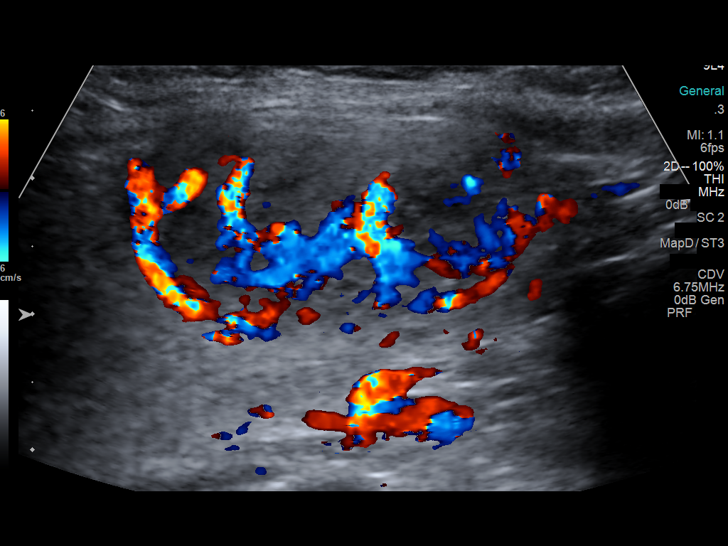
[im 9/22]
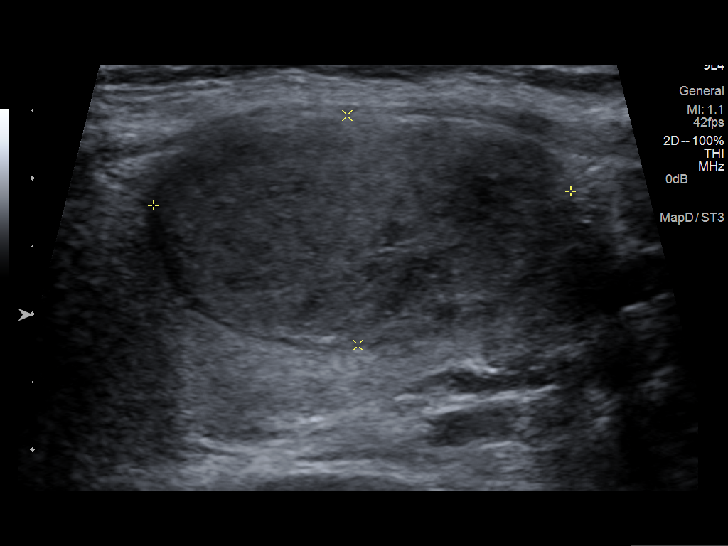
[im 11/22]
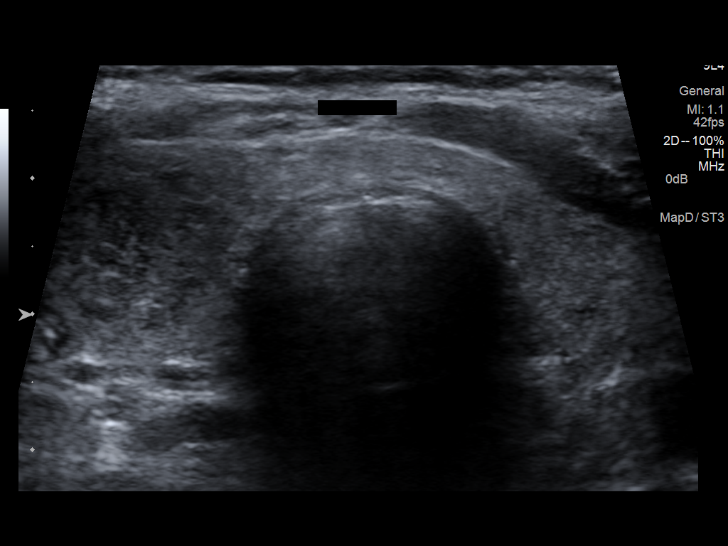
[im 12/22]
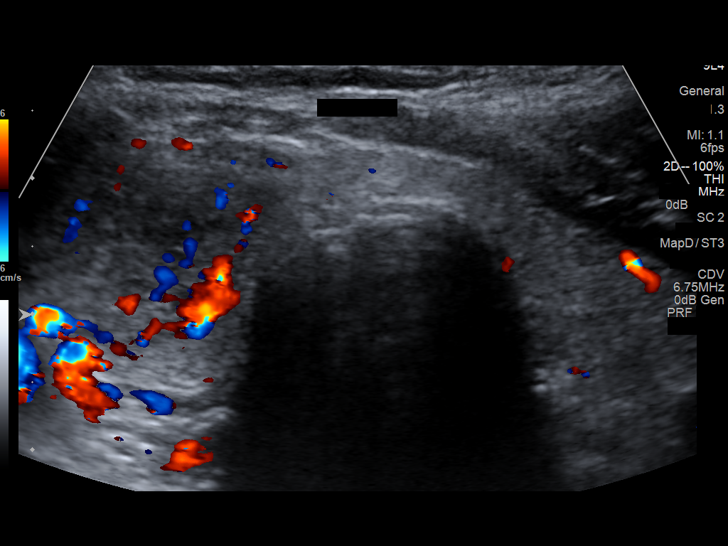
[im 14/22]
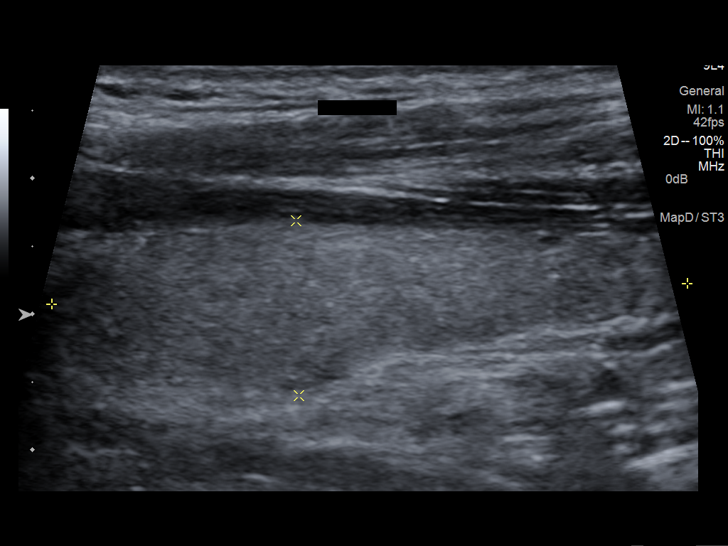
[im 15/22]
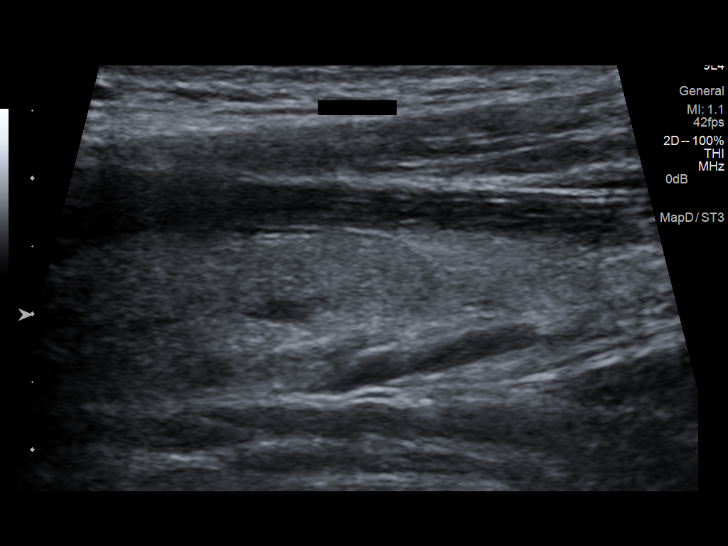
[im 17/22]
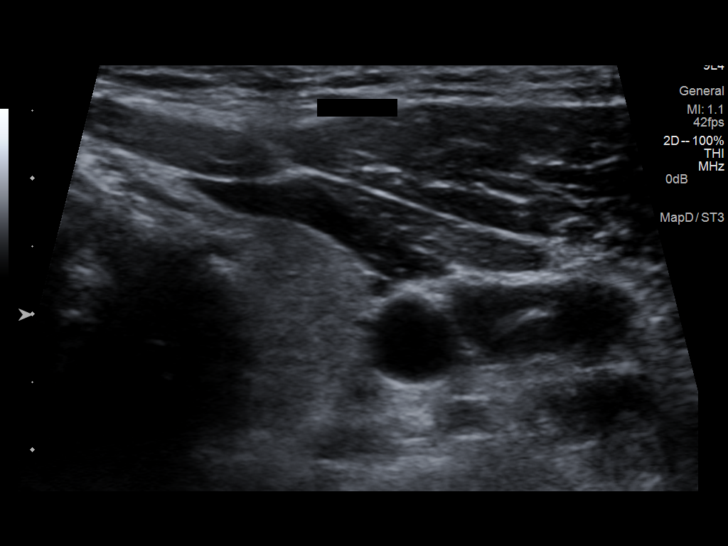
[im 19/22]
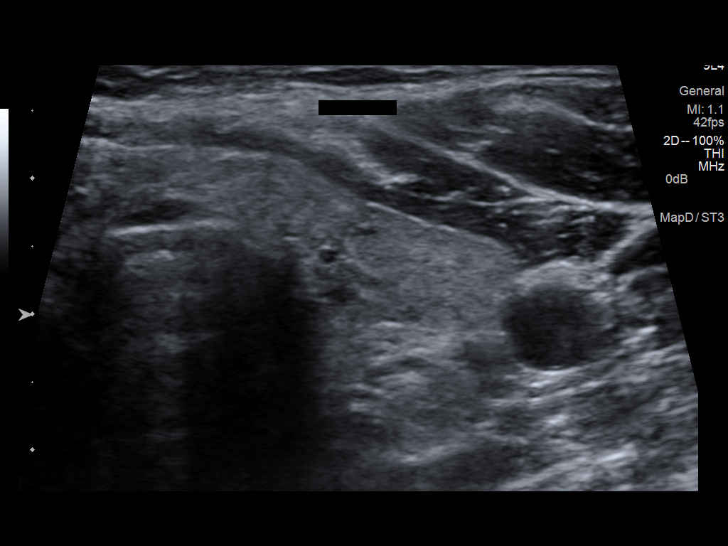
[im 20/22]
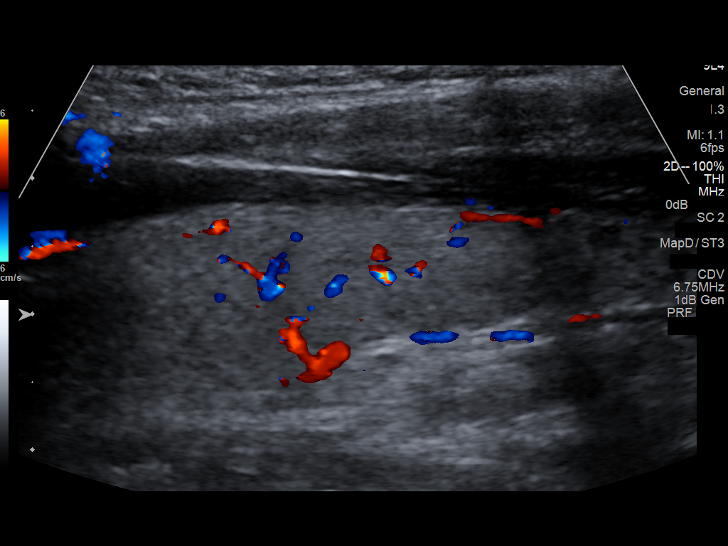
[im 22/22]
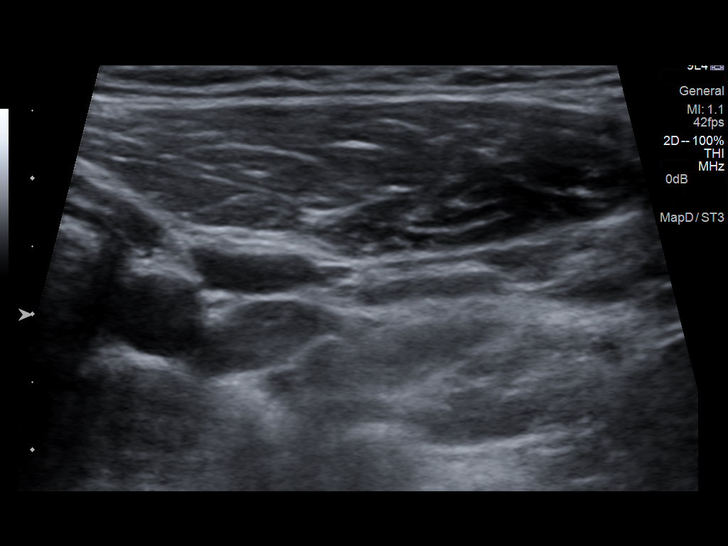

[14 of 22 positions shown; findings below may reference images not displayed]

FINDINGS: There is mild heterogeneity of the thyroid parenchymal echotexture.

Right thyroid lobe:  Borderline enlarged measuring 5.0 x 2.1 x
cm
Left thyroid lobe:  Normal in size measuring 4.7 x 1.3 x 1.6 cm
Isthmus:  Normal size measuring 0.4 cm in diameter

Focal nodules:

Right, mid/inferior - 3.1 x 1.7 x 2.4 cm - mixed echogenic,
partially cystic, predominantly solid with internal blood flow.

Lymphadenopathy:  None visualized.
IMPRESSION: 1.  Indeterminate approximately 3.1 cm nodule within the right lobe
of the thyroid.  This nodule meet size criteria for percutaneous
sampling as indicated.  This recommendation follows the consensus
statement:  Management of Thyroid Nodules Detected at US:  Society
of Radiologists in Ultrasound Consensus Conference Statement.

2.  If there remains concern for an additional neck mass, further
evaluation may be performed with neck CT as clinically indicated.

## 2014-02-25 IMAGING — CT CT NECK W/ CM
4 of 6 series · 15 of 33 positions shown, 17 images · IV contrast (75CC OMNI 300)
Comparison: Thyroid ultrasound 10/09/2012.

CLINICAL DATA: Neoplasm of uncertain behavior of the other and
unspecified endocrine glands. Recent onset right thyroid mass. FNA
shows follicular lesion. The patient is scheduled for open biopsy.

EXAM:
CT NECK WITH CONTRAST
TECHNIQUE: Multidetector CT imaging of the neck was performed using the
standard protocol following the bolus administration of intravenous
contrast.
CONTRAST:  75mL OMNIPAQUE IOHEXOL 300 MG/ML  SOLN

[Series 3: axial neck · axial · 0.37mm/px · z∈[+76,+209]mm · 3 of 107 slices shown]
[im 27/107  bone]
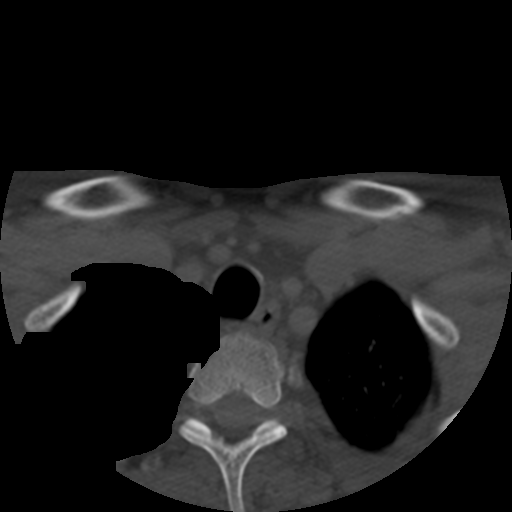
[im 54/107  bone]
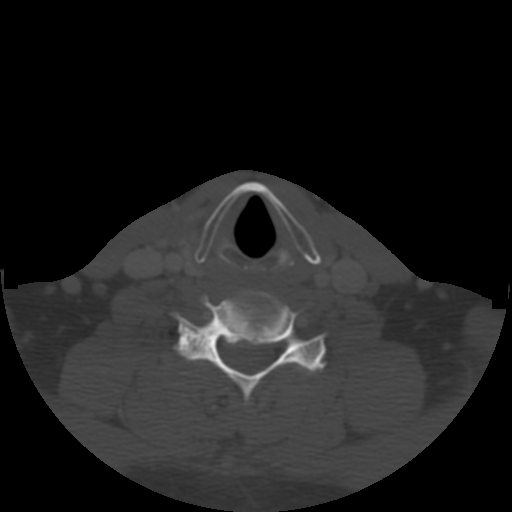
[im 80/107  bone]
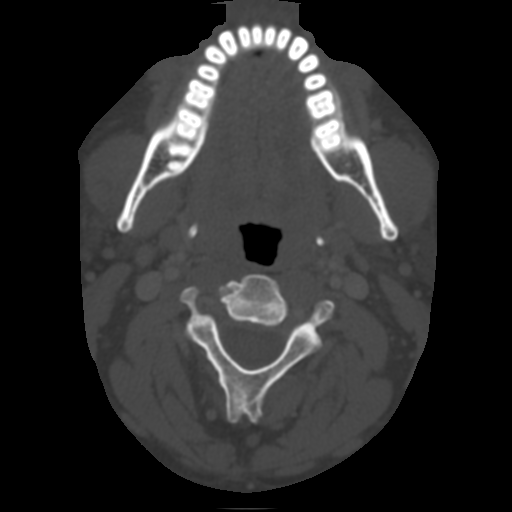

[Series 200: coronal · coronal · 0.54mm/px · 3 of 93 slices shown]
[im 19/93  bone]
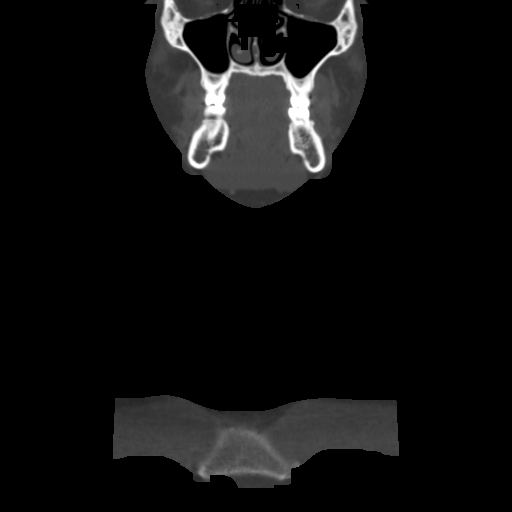
[im 37/93  bone]
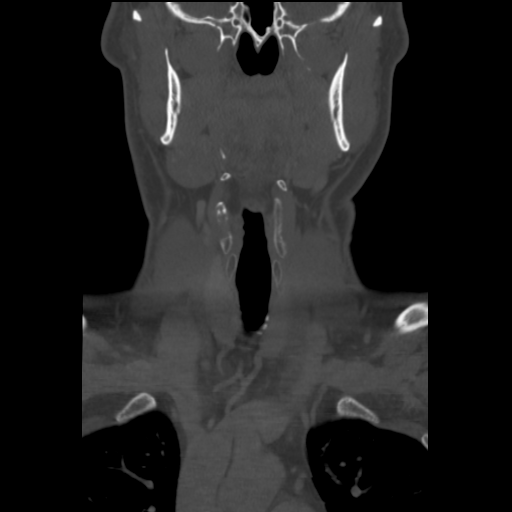
[im 56/93  bone]
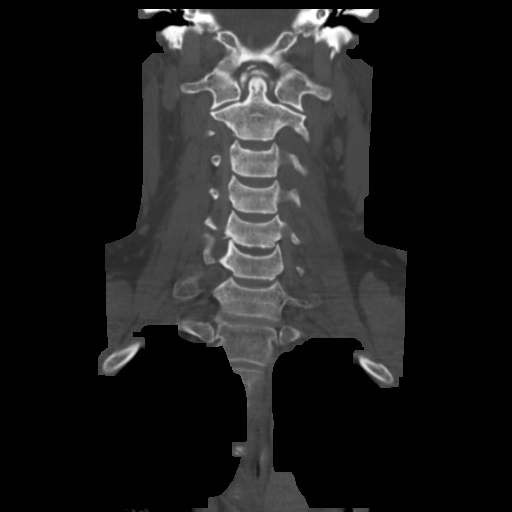

[Series 201: sagittal · sagittal · 0.54mm/px · 5 of 96 slices shown, 6 images]
[im 32/96  bone]
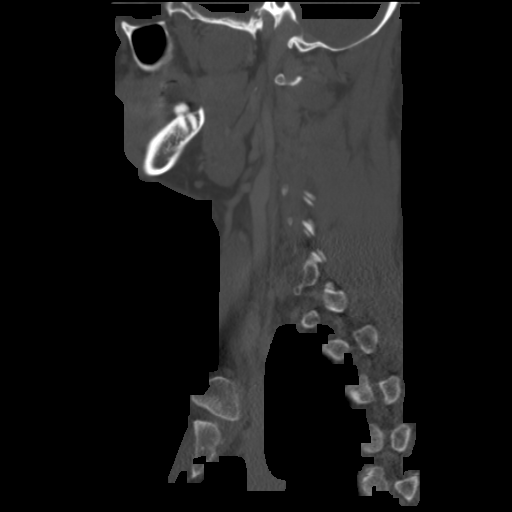
[im 40/96  bone]
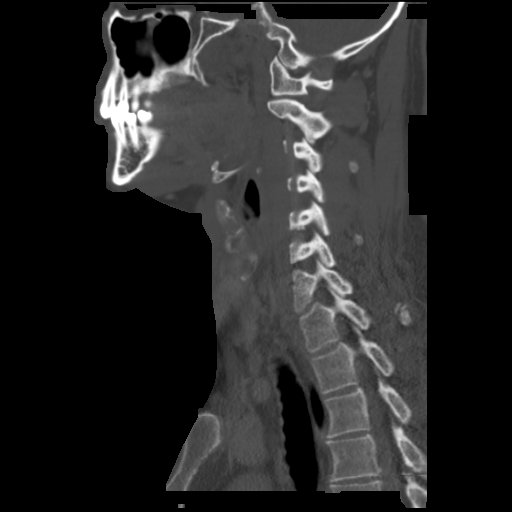
[im 48/96  soft-tissue]
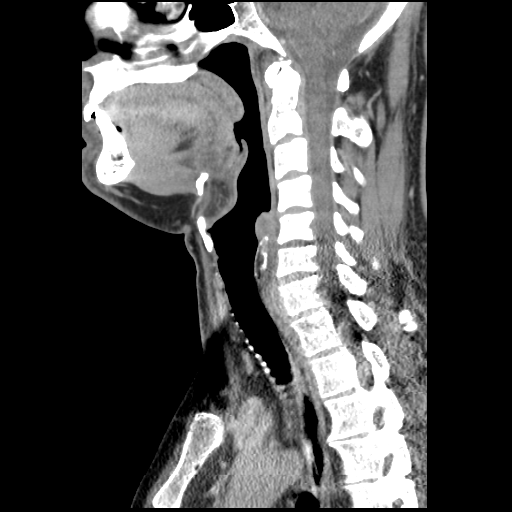
[im 48/96  bone]
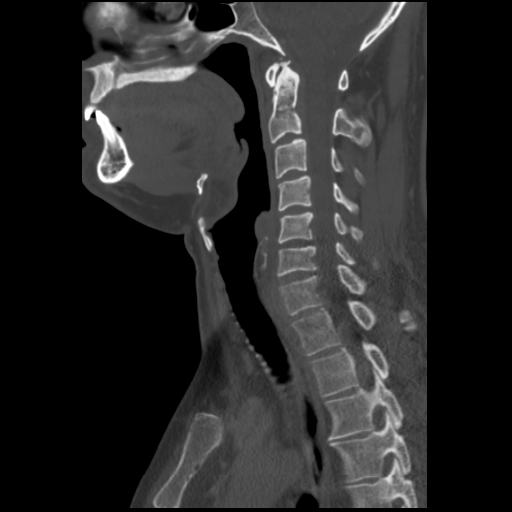
[im 56/96  bone]
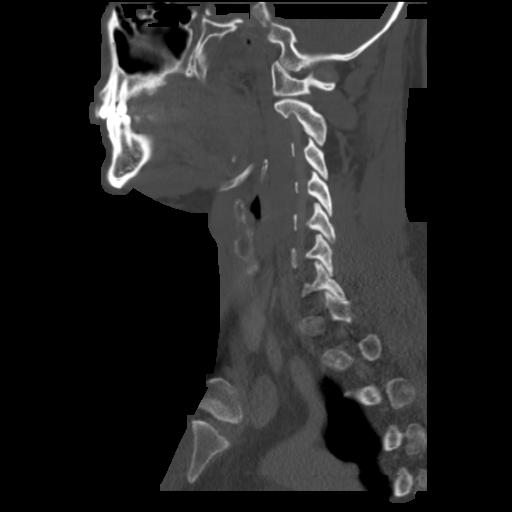
[im 64/96  bone]
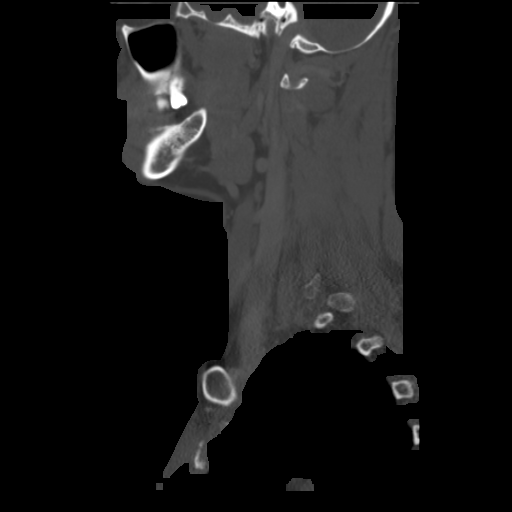

[Series 202: angled to hyoid · axial · 0.54mm/px · z∈[+34,+202]mm · 4 of 144 slices shown, 5 images]
[im 29/144  soft-tissue]
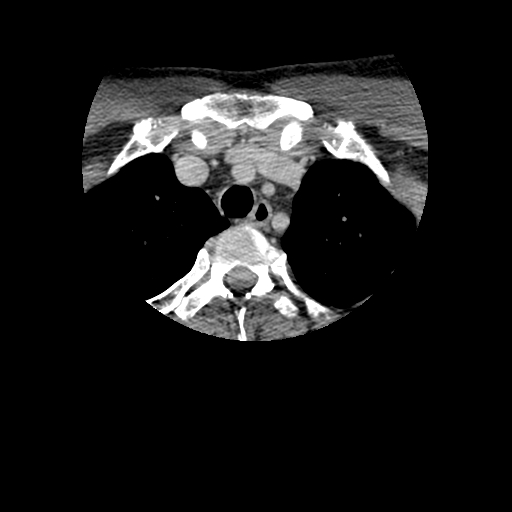
[im 29/144  bone]
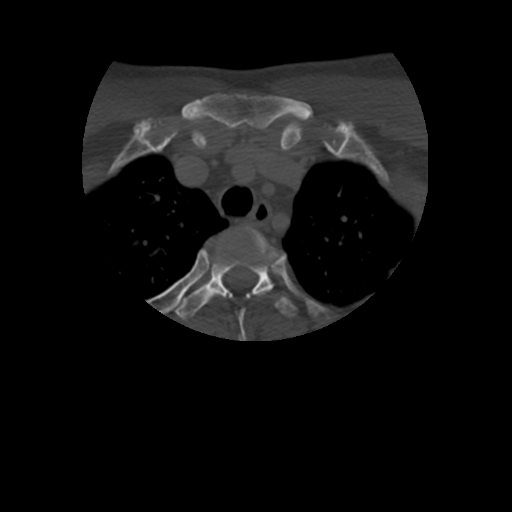
[im 58/144  bone]
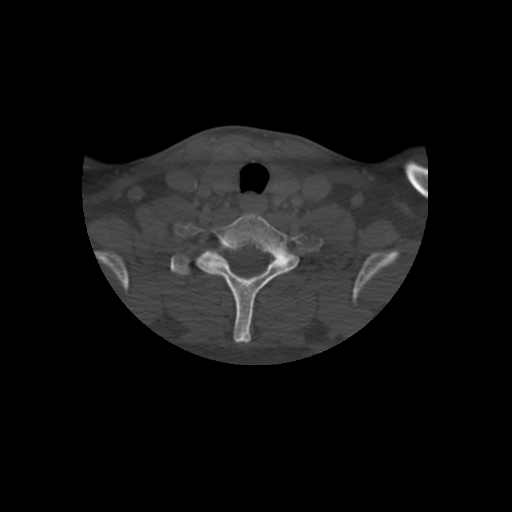
[im 86/144  bone]
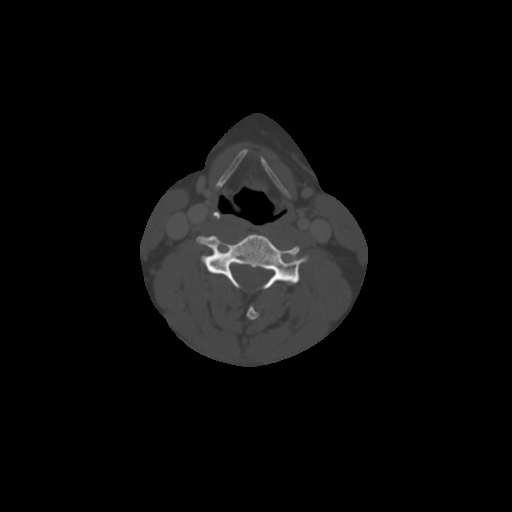
[im 115/144  bone]
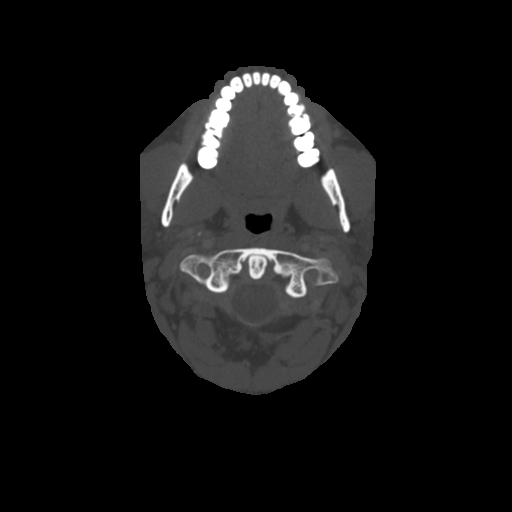

[15 of 33 positions shown; findings below may reference images not displayed]

FINDINGS: The right thyroid nodule measures 2.4 x 1.8 x 3.1 cm. The borders
are somewhat indistinct although there is no invasion of the
adjacent structures.

No significant cervical adenopathy is present. The and a
jugulodigastric lymph node on the right is within normal limits. No
level 3 or level 4 enlarged nodes are present.

No focal mucosal or submucosal lesions are present. There is mild
prominence of the palatine tonsils bilaterally. No discrete mass or
abscess is present.

The superior mediastinum is within normal limits. The lungs are
clear.

The bone windows demonstrate no focal lytic or blastic lesions. Mild
endplate degenerative changes are present at C5-6 and C6-7 spinous
process fracture is healed at T1.
IMPRESSION: 1. 3.1 cm right thyroid nodule without evidence for invasion of
adjacent structures.
2. No significant cervical adenopathy to suggest metastatic disease.
3. Minimal degenerative changes within the cervical spine.

## 2019-07-09 ENCOUNTER — Ambulatory Visit (INDEPENDENT_AMBULATORY_CARE_PROVIDER_SITE_OTHER): Payer: PRIVATE HEALTH INSURANCE

## 2019-07-09 ENCOUNTER — Other Ambulatory Visit: Payer: Self-pay | Admitting: Sports Medicine

## 2019-07-09 ENCOUNTER — Ambulatory Visit (INDEPENDENT_AMBULATORY_CARE_PROVIDER_SITE_OTHER): Payer: PRIVATE HEALTH INSURANCE | Admitting: Sports Medicine

## 2019-07-09 ENCOUNTER — Other Ambulatory Visit: Payer: Self-pay

## 2019-07-09 ENCOUNTER — Encounter: Payer: Self-pay | Admitting: Sports Medicine

## 2019-07-09 DIAGNOSIS — M722 Plantar fascial fibromatosis: Secondary | ICD-10-CM

## 2019-07-09 DIAGNOSIS — M79672 Pain in left foot: Secondary | ICD-10-CM

## 2019-07-09 DIAGNOSIS — M216X2 Other acquired deformities of left foot: Secondary | ICD-10-CM | POA: Diagnosis not present

## 2019-07-09 DIAGNOSIS — M79671 Pain in right foot: Secondary | ICD-10-CM

## 2019-07-09 MED ORDER — PREDNISONE 10 MG (21) PO TBPK: ORAL_TABLET | ORAL | 0 refills | Status: DC

## 2019-07-09 MED ORDER — MELOXICAM 15 MG PO TABS: 15.0000 mg | ORAL_TABLET | Freq: Every day | ORAL | 0 refills | Status: DC

## 2019-07-09 MED ORDER — TRIAMCINOLONE ACETONIDE 10 MG/ML IJ SUSP
10.0000 mg | Freq: Once | INTRAMUSCULAR | Status: AC
  Administered 2019-07-09: 21:00:00 10 mg

## 2019-07-09 NOTE — Progress Notes (Signed)
Subjective: Evan Carter is a 49 y.o. male patient presents to office with complaint of moderate heel pain on the left. Patient admits to post static dyskinesia for 6 months in duration, pain 7/10 sharp to heel worse in the morning and had a history of plantar fasciitis 10 years. Worse with foot pain 12-15 hours at work. Patient has treated this problem with OTC inserts, rolling with bottle with no relief. Denies any other pedal complaints.   Review of Systems  All other systems reviewed and are negative.   Patient Active Problem List   Diagnosis Date Noted  . Thyroid neoplasm 11/08/2012    No current outpatient medications on file prior to visit.   No current facility-administered medications on file prior to visit.    No Known Allergies  Objective: Physical Exam General: The patient is alert and oriented x3 in no acute distress.  Dermatology: Skin is warm, dry and supple bilateral lower extremities. Nails 1-10 are normal. There is no erythema, edema, no eccymosis, no open lesions present. Integument is otherwise unremarkable.  Vascular: Dorsalis Pedis pulse and Posterior Tibial pulse are 2/4 bilateral. Capillary fill time is immediate to all digits.  Neurological: Grossly intact to light touch bilateral.  Musculoskeletal: Tenderness to palpation at the medial calcaneal tubercale and through the insertion of the plantar fascia on the left foot. No pain with compression of calcaneus bilateral. No pain with tuning fork to calcaneus bilateral. No pain with calf compression bilateral. There is decreased Ankle joint range of motion bilateral. All other joints range of motion within normal limits bilateral. Strength 5/5 in all groups bilateral.   Gait: Unassisted, Antalgic avoid weight on left heel  Xray, Left foot:  Normal osseous mineralization. Joint spaces preserved. No fracture/dislocation/boney destruction. Calcaneal spur present with mild thickening of plantar fascia. No other  soft tissue abnormalities or radiopaque foreign bodies.   Assessment and Plan: Problem List Items Addressed This Visit    None    Visit Diagnoses    Plantar fasciitis of left foot    -  Primary   Relevant Medications   triamcinolone acetonide (KENALOG) 10 MG/ML injection 10 mg (Completed) (Start on 07/09/2019  9:15 PM)   Pain of left heel       Acquired equinus deformity of left foot          -Complete examination performed.  -Xrays reviewed -Discussed with patient in detail the condition of plantar fasciitis, how this occurs and general treatment options. Explained both conservative and surgical treatments.  -After oral consent and aseptic prep, injected a mixture containing 1 ml of 2%  plain lidocaine, 1 ml 0.5% plain marcaine, 0.5 ml of kenalog 10 and 0.5 ml of dexamethasone phosphate into left heel. Post-injection care discussed with patient.  -Rx Meloxicam to start after prednisone dose pack is completed -Recommended good supportive shoes and advised use of OTC heel lifts. -Explained and dispensed to patient daily stretching exercises. -Recommend patient to ice affected area 1-2x daily. -Patient to return to office in 3-4 weeks for follow up or sooner if problems or questions arise.  Landis Martins, DPM

## 2019-07-09 NOTE — Patient Instructions (Signed)

## 2019-08-01 ENCOUNTER — Other Ambulatory Visit: Payer: Self-pay | Admitting: Sports Medicine

## 2019-08-07 ENCOUNTER — Other Ambulatory Visit: Payer: Self-pay

## 2019-08-07 ENCOUNTER — Encounter: Payer: Self-pay | Admitting: Sports Medicine

## 2019-08-07 ENCOUNTER — Ambulatory Visit (INDEPENDENT_AMBULATORY_CARE_PROVIDER_SITE_OTHER): Payer: PRIVATE HEALTH INSURANCE | Admitting: Sports Medicine

## 2019-08-07 DIAGNOSIS — M79672 Pain in left foot: Secondary | ICD-10-CM | POA: Diagnosis not present

## 2019-08-07 DIAGNOSIS — M216X2 Other acquired deformities of left foot: Secondary | ICD-10-CM

## 2019-08-07 DIAGNOSIS — M722 Plantar fascial fibromatosis: Secondary | ICD-10-CM

## 2019-08-07 NOTE — Progress Notes (Signed)
Subjective: Evan Carter is a 49 y.o. male returns to office for follow up evaluation after left heel injection for plantar fasciitis, injection #1 administered for weeks ago. Patient states that the injection seems to help his pain now 1-2 out of 10 and has  decreased in frequency to the area. Patient denies any recent changes in medications or new problems since last visit.   Patient Active Problem List   Diagnosis Date Noted  . Thyroid neoplasm 11/08/2012    Current Outpatient Medications on File Prior to Visit  Medication Sig Dispense Refill  . meloxicam (MOBIC) 15 MG tablet TAKE 1 TABLET BY MOUTH EVERY DAY 30 tablet 0   No current facility-administered medications on file prior to visit.    No Known Allergies  Objective:   General:  Alert and oriented x 3, in no acute distress  Dermatology: Skin is warm, dry, and supple bilateral. Nails are within normal limits. There is no lower extremity erythema, no eccymosis, no open lesions present bilateral.   Vascular: Dorsalis Pedis and Posterior Tibial pedal pulses are 2/4 bilateral. + hair growth noted bilateral. Capillary Fill Time is 3 seconds in all digits. No varicosities, No edema bilateral lower extremities.   Neurological: Sensation grossly intact to light touch bilateral.  Musculoskeletal: There is no reproducible pain to palpation to the medial calcaneal tubercle of the fascial insertion on the left foot.There is decreased Ankle joint range of motion bilateral. All other jointsrange of motion  within normal limits bilateral. Strength 5/5 bilateral.   Assessment and Plan: Problem List Items Addressed This Visit    None    Visit Diagnoses    Plantar fasciitis of left foot    -  Primary   Pain of left heel       Acquired equinus deformity of left foot          -Complete examination performed.  -Previous x-rays reviewed. -Re-Discussed with patient in detail the condition of plantar fasciitis, how this  occurs related  to the foot type of the patient and general treatment options. -No reinjection at this time since pain is minimal -Advised patient to continue with meloxicam until completed -Continue with stretching, icing, good supportive shoes -Recommend custom functional foot orthotics -Discussed long term care and reocurrence; will closely monitor; if fails to improve will consider other treatment modalities.  -Patient to return to office for casting for orthotics or sooner if problems or questions arise.  Landis Martins, DPM

## 2019-08-26 ENCOUNTER — Other Ambulatory Visit: Payer: PRIVATE HEALTH INSURANCE

## 2019-09-23 ENCOUNTER — Other Ambulatory Visit: Payer: PRIVATE HEALTH INSURANCE | Admitting: Orthotics

## 2020-05-03 ENCOUNTER — Other Ambulatory Visit: Payer: Self-pay

## 2020-05-03 ENCOUNTER — Ambulatory Visit (INDEPENDENT_AMBULATORY_CARE_PROVIDER_SITE_OTHER): Payer: PRIVATE HEALTH INSURANCE

## 2020-05-03 ENCOUNTER — Ambulatory Visit (INDEPENDENT_AMBULATORY_CARE_PROVIDER_SITE_OTHER): Payer: PRIVATE HEALTH INSURANCE | Admitting: Podiatry

## 2020-05-03 ENCOUNTER — Other Ambulatory Visit: Payer: Self-pay | Admitting: Podiatry

## 2020-05-03 DIAGNOSIS — M722 Plantar fascial fibromatosis: Secondary | ICD-10-CM

## 2020-05-03 DIAGNOSIS — M7732 Calcaneal spur, left foot: Secondary | ICD-10-CM

## 2020-05-03 DIAGNOSIS — M216X9 Other acquired deformities of unspecified foot: Secondary | ICD-10-CM | POA: Diagnosis not present

## 2020-05-03 MED ORDER — BETAMETHASONE SOD PHOS & ACET 6 (3-3) MG/ML IJ SUSP
6.0000 mg | Freq: Once | INTRAMUSCULAR | Status: AC
  Administered 2020-05-03: 6 mg

## 2020-05-03 NOTE — Progress Notes (Signed)
  Subjective:  Patient ID: Evan Carter, male    DOB: 1970/03/10,  MRN: [id number]  Chief Complaint  Patient presents with  . Plantar Fasciitis    Flare up pain at Lt bottom heel x 3 wks; 4/10 sharp pains -worse in AM or 1st step -no swelling Tx: none -pt states pain starts after excessive activities   50 y.o. male presents with the above complaint. History confirmed with patient.   Objective:  Physical Exam: warm, good capillary refill, no trophic changes or ulcerative lesions, normal DP and PT pulses and normal sensory exam. Left Foot: tenderness to palpation medial calcaneal tuber, no pain with calcaneal squeeze, decreased ankle joint ROM and +Silverskiold test  Radiographs: X-ray of the left foot: no evidence of calcaneal stress fracture, plantar calcaneal spur, posterior calcaneal spur and Haglund deformity noted  Assessment:   1. Plantar fasciitis of left foot   2. Equinus deformity of foot   3. Calcaneal spur of left foot    Plan:  Patient was evaluated and treated and all questions answered.  Plantar Fasciitis -XR reviewed with patient -Educated patient on stretching and icing of the affected limb -Injection delivered to the plantar fascia of the left foot.  Procedure: Injection Tendon/Ligament Consent: Verbal consent obtained. Location: Left plantar fascia at the glabrous junction; medial approach. Skin Prep: Alcohol. Injectate: 1 cc 0.5% marcaine plain, 1 cc betamethasone acetate-betamethasone sodium phosphate Disposition: Patient tolerated procedure well. Injection site dressed with a band-aid.  Return if symptoms worsen or fail to improve.

## 2020-05-03 NOTE — Patient Instructions (Signed)
Recommend on Amazon:  ProStretch The Original Calf Stretcher and Foot Rocker for Plantar Fasciitis, Achilles Tendonitis and Tight Calves     Plantar Fasciitis (Heel Spur Syndrome) with Rehab The plantar fascia is a fibrous, ligament-like, soft-tissue structure that spans the bottom of the foot. Plantar fasciitis is a condition that causes pain in the foot due to inflammation of the tissue. SYMPTOMS   Pain and tenderness on the underneath side of the foot.  Pain that worsens with standing or walking. CAUSES  Plantar fasciitis is caused by irritation and injury to the plantar fascia on the underneath side of the foot. Common mechanisms of injury include:  Direct trauma to bottom of the foot.  Damage to a small nerve that runs under the foot where the main fascia attaches to the heel bone.  Stress placed on the plantar fascia due to bone spurs. RISK INCREASES WITH:   Activities that place stress on the plantar fascia (running, jumping, pivoting, or cutting).  Poor strength and flexibility.  Improperly fitted shoes.  Tight calf muscles.  Flat feet.  Failure to warm-up properly before activity.  Obesity. PREVENTION  Warm up and stretch properly before activity.  Allow for adequate recovery between workouts.  Maintain physical fitness:  Strength, flexibility, and endurance.  Cardiovascular fitness.  Maintain a health body weight.  Avoid stress on the plantar fascia.  Wear properly fitted shoes, including arch supports for individuals who have flat feet.  PROGNOSIS  If treated properly, then the symptoms of plantar fasciitis usually resolve without surgery. However, occasionally surgery is necessary.  RELATED COMPLICATIONS   Recurrent symptoms that may result in a chronic condition.  Problems of the lower back that are caused by compensating for the injury, such as limping.  Pain or weakness of the foot during push-off following surgery.  Chronic  inflammation, scarring, and partial or complete fascia tear, occurring more often from repeated injections.  TREATMENT  Treatment initially involves the use of ice and medication to help reduce pain and inflammation. The use of strengthening and stretching exercises may help reduce pain with activity, especially stretches of the Achilles tendon. These exercises may be performed at home or with a therapist. Your caregiver may recommend that you use heel cups of arch supports to help reduce stress on the plantar fascia. Occasionally, corticosteroid injections are given to reduce inflammation. If symptoms persist for greater than 6 months despite non-surgical (conservative), then surgery may be recommended.   MEDICATION   If pain medication is necessary, then nonsteroidal anti-inflammatory medications, such as aspirin and ibuprofen, or other minor pain relievers, such as acetaminophen, are often recommended.  Do not take pain medication within 7 days before surgery.  Prescription pain relievers may be given if deemed necessary by your caregiver. Use only as directed and only as much as you need.  Corticosteroid injections may be given by your caregiver. These injections should be reserved for the most serious cases, because they may only be given a certain number of times.  HEAT AND COLD  Cold treatment (icing) relieves pain and reduces inflammation. Cold treatment should be applied for 10 to 15 minutes every 2 to 3 hours for inflammation and pain and immediately after any activity that aggravates your symptoms. Use ice packs or massage the area with a piece of ice (ice massage).  Heat treatment may be used prior to performing the stretching and strengthening activities prescribed by your caregiver, physical therapist, or athletic trainer. Use a heat pack or soak the   injury in warm water.  SEEK IMMEDIATE MEDICAL CARE IF:  Treatment seems to offer no benefit, or the condition worsens.  Any  medications produce adverse side effects.  EXERCISES- RANGE OF MOTION (ROM) AND STRETCHING EXERCISES - Plantar Fasciitis (Heel Spur Syndrome) These exercises may help you when beginning to rehabilitate your injury. Your symptoms may resolve with or without further involvement from your physician, physical therapist or athletic trainer. While completing these exercises, remember:   Restoring tissue flexibility helps normal motion to return to the joints. This allows healthier, less painful movement and activity.  An effective stretch should be held for at least 30 seconds.  A stretch should never be painful. You should only feel a gentle lengthening or release in the stretched tissue.  RANGE OF MOTION - Toe Extension, Flexion  Sit with your right / left leg crossed over your opposite knee.  Grasp your toes and gently pull them back toward the top of your foot. You should feel a stretch on the bottom of your toes and/or foot.  Hold this stretch for 10 seconds.  Now, gently pull your toes toward the bottom of your foot. You should feel a stretch on the top of your toes and or foot.  Hold this stretch for 10 seconds. Repeat  times. Complete this stretch 3 times per day.   RANGE OF MOTION - Ankle Dorsiflexion, Active Assisted  Remove shoes and sit on a chair that is preferably not on a carpeted surface.  Place right / left foot under knee. Extend your opposite leg for support.  Keeping your heel down, slide your right / left foot back toward the chair until you feel a stretch at your ankle or calf. If you do not feel a stretch, slide your bottom forward to the edge of the chair, while still keeping your heel down.  Hold this stretch for 10 seconds. Repeat 3 times. Complete this stretch 2 times per day.   STRETCH  Gastroc, Standing  Place hands on wall.  Extend right / left leg, keeping the front knee somewhat bent.  Slightly point your toes inward on your back foot.  Keeping  your right / left heel on the floor and your knee straight, shift your weight toward the wall, not allowing your back to arch.  You should feel a gentle stretch in the right / left calf. Hold this position for 10 seconds. Repeat 3 times. Complete this stretch 2 times per day.  STRETCH  Soleus, Standing  Place hands on wall.  Extend right / left leg, keeping the other knee somewhat bent.  Slightly point your toes inward on your back foot.  Keep your right / left heel on the floor, bend your back knee, and slightly shift your weight over the back leg so that you feel a gentle stretch deep in your back calf.  Hold this position for 10 seconds. Repeat 3 times. Complete this stretch 2 times per day.  STRETCH  Gastrocsoleus, Standing  Note: This exercise can place a lot of stress on your foot and ankle. Please complete this exercise only if specifically instructed by your caregiver.   Place the ball of your right / left foot on a step, keeping your other foot firmly on the same step.  Hold on to the wall or a rail for balance.  Slowly lift your other foot, allowing your body weight to press your heel down over the edge of the step.  You should feel a stretch in   your right / left calf.  Hold this position for 10 seconds.  Repeat this exercise with a slight bend in your right / left knee. Repeat 3 times. Complete this stretch 2 times per day.   STRENGTHENING EXERCISES - Plantar Fasciitis (Heel Spur Syndrome)  These exercises may help you when beginning to rehabilitate your injury. They may resolve your symptoms with or without further involvement from your physician, physical therapist or athletic trainer. While completing these exercises, remember:   Muscles can gain both the endurance and the strength needed for everyday activities through controlled exercises.  Complete these exercises as instructed by your physician, physical therapist or athletic trainer. Progress the resistance  and repetitions only as guided.  STRENGTH - Towel Curls  Sit in a chair positioned on a non-carpeted surface.  Place your foot on a towel, keeping your heel on the floor.  Pull the towel toward your heel by only curling your toes. Keep your heel on the floor. Repeat 3 times. Complete this exercise 2 times per day.  STRENGTH - Ankle Inversion  Secure one end of a rubber exercise band/tubing to a fixed object (table, pole). Loop the other end around your foot just before your toes.  Place your fists between your knees. This will focus your strengthening at your ankle.  Slowly, pull your big toe up and in, making sure the band/tubing is positioned to resist the entire motion.  Hold this position for 10 seconds.  Have your muscles resist the band/tubing as it slowly pulls your foot back to the starting position. Repeat 3 times. Complete this exercises 2 times per day.  Document Released: 12/26/2004 Document Revised: 03/20/2011 Document Reviewed: 04/09/2008 [employer] Patient Information 2014 [employer].  

## 2020-05-19 ENCOUNTER — Ambulatory Visit (INDEPENDENT_AMBULATORY_CARE_PROVIDER_SITE_OTHER): Payer: PRIVATE HEALTH INSURANCE | Admitting: Podiatry

## 2020-05-19 ENCOUNTER — Other Ambulatory Visit: Payer: Self-pay

## 2020-05-19 DIAGNOSIS — M722 Plantar fascial fibromatosis: Secondary | ICD-10-CM

## 2020-05-19 DIAGNOSIS — M7732 Calcaneal spur, left foot: Secondary | ICD-10-CM

## 2020-05-19 DIAGNOSIS — M216X9 Other acquired deformities of unspecified foot: Secondary | ICD-10-CM

## 2020-05-19 NOTE — Progress Notes (Signed)
Patient presents to be casted for orthotics.   A foam impression was casted for his right and left foot  Patient is a size 11 1/2 to 12  Patient will be contacted when the orthotics are ready for pick up

## 2020-06-23 ENCOUNTER — Other Ambulatory Visit: Payer: Self-pay

## 2020-06-23 ENCOUNTER — Ambulatory Visit (INDEPENDENT_AMBULATORY_CARE_PROVIDER_SITE_OTHER): Payer: PRIVATE HEALTH INSURANCE | Admitting: Podiatry

## 2020-06-23 DIAGNOSIS — M722 Plantar fascial fibromatosis: Secondary | ICD-10-CM

## 2020-06-23 DIAGNOSIS — M216X9 Other acquired deformities of unspecified foot: Secondary | ICD-10-CM

## 2020-06-23 DIAGNOSIS — M7732 Calcaneal spur, left foot: Secondary | ICD-10-CM

## 2020-06-23 NOTE — Patient Instructions (Signed)

## 2020-06-23 NOTE — Progress Notes (Signed)
Patient presents today for orthotic pick up. Patient voices no new complaints.  Orthotics were fitted to patient's feet. No discomfort and no rubbing. Patient satisfied with the orthotics.  Orthotics were dispensed to patient with instructions for break in wear and to call the office with any concerns or questions. 

## 2020-11-08 ENCOUNTER — Other Ambulatory Visit: Payer: Self-pay

## 2020-11-08 ENCOUNTER — Ambulatory Visit (INDEPENDENT_AMBULATORY_CARE_PROVIDER_SITE_OTHER): Payer: PRIVATE HEALTH INSURANCE | Admitting: Podiatry

## 2020-11-08 DIAGNOSIS — M722 Plantar fascial fibromatosis: Secondary | ICD-10-CM

## 2020-11-08 MED ORDER — BETAMETHASONE SOD PHOS & ACET 6 (3-3) MG/ML IJ SUSP
6.0000 mg | Freq: Once | INTRAMUSCULAR | Status: AC
  Administered 2020-11-08: 6 mg

## 2020-11-08 NOTE — Progress Notes (Signed)
  Subjective:  Patient ID: Evan Carter, male    DOB: Jan 02, 1971,  MRN: [id number]  Chief Complaint  Patient presents with   Plantar Fasciitis    F/U Lt pF -pt states," not hurting as bad but going to start to have more work." - pt requesting inj. - 4/10 pain tx: stretching and custome inserts and NS   50 y.o. male presents with the above complaint. History confirmed with patient.   Objective:  Physical Exam: warm, good capillary refill, no trophic changes or ulcerative lesions, normal DP and PT pulses and normal sensory exam. Left Foot: tenderness to palpation medial calcaneal tuber, no pain with calcaneal squeeze, and normal ankle joint ROM  Assessment:   1. Plantar fasciitis of left foot    Plan:  Patient was evaluated and treated and all questions answered.  Plantar Fasciitis -Repeat injection delivered to the left foot. Continue night splint as needed. He does appear to have reasonable ankle ROM, hopefully with enough support this should resolve  Procedure: Injection Tendon/Ligament Consent: Verbal consent obtained. Location: Left plantar fascia at the glabrous junction; medial approach. Skin Prep: Alcohol. Injectate: 1 cc 0.5% marcaine plain, 1 cc betamethasone acetate-betamethasone sodium phosphate Disposition: Patient tolerated procedure well. Injection site dressed with a band-aid.    No follow-ups on file.

## 2021-04-25 ENCOUNTER — Ambulatory Visit: Payer: PRIVATE HEALTH INSURANCE | Admitting: Podiatry

## 2021-04-25 ENCOUNTER — Ambulatory Visit (INDEPENDENT_AMBULATORY_CARE_PROVIDER_SITE_OTHER): Payer: PRIVATE HEALTH INSURANCE | Admitting: Podiatry

## 2021-04-25 ENCOUNTER — Encounter: Payer: Self-pay | Admitting: Podiatry

## 2021-04-25 DIAGNOSIS — M216X9 Other acquired deformities of unspecified foot: Secondary | ICD-10-CM

## 2021-04-25 DIAGNOSIS — M722 Plantar fascial fibromatosis: Secondary | ICD-10-CM

## 2021-04-25 MED ORDER — DEXAMETHASONE SODIUM PHOSPHATE 120 MG/30ML IJ SOLN
4.0000 mg | Freq: Once | INTRAMUSCULAR | Status: AC
  Administered 2021-04-25: 4 mg via INTRA_ARTICULAR

## 2021-04-25 NOTE — Progress Notes (Signed)
?  Subjective:  ?Patient ID: Evan Carter, male    DOB: 16-Nov-1970,  MRN: [id number] ? ?Chief Complaint  ?Patient presents with  ? Plantar Fasciitis  ?  The shot did help on the left heel and I can tell it is there by the end of the day and I am going hurting with my son and I don't want it to flare up   ? ?51 y.o. male presents with the above complaint. History confirmed with patient.  ? ?Objective:  ?Physical Exam: ?warm, good capillary refill, no trophic changes or ulcerative lesions, normal DP and PT pulses and normal sensory exam. ?Left Foot: tenderness to palpation medial calcaneal tuber, no pain with calcaneal squeeze, and normal ankle joint ROM ? ?Assessment:  ? ?1. Plantar fasciitis of left foot   ?2. Equinus deformity of foot   ? ?Plan:  ?Patient was evaluated and treated and all questions answered. ? ?Plantar Fasciitis ?-Repeat injection delivered to the left foot. Continue night splint as needed. He does appear to have reasonable ankle ROM, hopefully with enough support this should resolve ? ?Procedure: Injection Tendon/Ligament ?Consent: Verbal consent obtained. ?Location: Left plantar fascia at the glabrous junction; medial approach. ?Skin Prep: Alcohol. ?Injectate: 1 cc 0.5% marcaine plain, 1 cc betamethasone acetate-betamethasone sodium phosphate ?Disposition: Patient tolerated procedure well. Injection site dressed with a band-aid. ? ? ? ?Return if symptoms worsen or fail to improve.  ?

## 2021-10-05 ENCOUNTER — Ambulatory Visit: Payer: PRIVATE HEALTH INSURANCE | Admitting: Podiatry

## 2021-10-10 ENCOUNTER — Ambulatory Visit: Payer: PRIVATE HEALTH INSURANCE | Admitting: Podiatry

## 2021-10-11 ENCOUNTER — Ambulatory Visit (INDEPENDENT_AMBULATORY_CARE_PROVIDER_SITE_OTHER): Payer: PRIVATE HEALTH INSURANCE | Admitting: Podiatry

## 2021-10-11 ENCOUNTER — Ambulatory Visit (INDEPENDENT_AMBULATORY_CARE_PROVIDER_SITE_OTHER): Payer: PRIVATE HEALTH INSURANCE

## 2021-10-11 DIAGNOSIS — M722 Plantar fascial fibromatosis: Secondary | ICD-10-CM

## 2021-10-11 NOTE — Progress Notes (Signed)
  Subjective:  Patient ID: Evan Carter, male    DOB: 1970/05/14,  MRN: [id number]  Chief Complaint  Patient presents with   Plantar Fasciitis    Plantar fasciitis to left foot and heel spur. Requesting steroid injection.    51 y.o. male presents with the above complaint.  Patient presents to the office today for injection in the left heel.  He states that he gets an injection about every 6 months and helps with the pain.  He has a known heel spur.  He also does all the other conservative measures that have previously been recommended.   Review of Systems: Negative except as noted in the HPI. Denies N/V/F/Ch.   Objective:  There were no vitals filed for this visit. There is no height or weight on file to calculate BMI. Constitutional Well developed. Well nourished.  Vascular Dorsalis pedis pulses palpable bilaterally. Posterior tibial pulses palpable bilaterally. Capillary refill normal to all digits.  No cyanosis or clubbing noted. Pedal hair growth normal.  Neurologic Normal speech. Oriented to person, place, and time. Epicritic sensation to light touch grossly present bilaterally.  Dermatologic Nails well groomed and normal in appearance. No open wounds. No skin lesions.  Orthopedic: Normal joint ROM without pain or crepitus bilaterally. No visible deformities. Tender to palpation at the calcaneal tuber left. No pain with calcaneal squeeze left. Ankle ROM diminished range of motion left. Silfverskiold Test: negative left.   Radiographs: Taken and reviewed. No acute fractures or dislocations. No evidence of stress fracture.  Plantar heel spur present. Posterior heel spur absent.   Assessment:   1. Plantar fasciitis of left foot    Plan:  Patient was evaluated and treated and all questions answered.  Plantar Fasciitis, left - XR reviewed as above.  - Educated on icing and stretching. Instructions given.  - Injection delivered to the plantar fascia as below. - DME:  Patient has night splint and other DME including orthotics - Pharmacologic management: NSAIDs as needed  Procedure: Injection Tendon/Ligament Location: Left plantar fascia at the glabrous junction; medial approach. Skin Prep: alcohol Injectate: 1 cc 0.5% marcaine plain, 1 cc kenalog 10. Disposition: Patient tolerated procedure well. Injection site dressed with a band-aid.  Return if symptoms worsen or fail to improve.

## 2022-03-24 ENCOUNTER — Ambulatory Visit (INDEPENDENT_AMBULATORY_CARE_PROVIDER_SITE_OTHER): Payer: PRIVATE HEALTH INSURANCE | Admitting: Podiatry

## 2022-03-24 ENCOUNTER — Ambulatory Visit (INDEPENDENT_AMBULATORY_CARE_PROVIDER_SITE_OTHER): Payer: PRIVATE HEALTH INSURANCE

## 2022-03-24 DIAGNOSIS — M722 Plantar fascial fibromatosis: Secondary | ICD-10-CM | POA: Diagnosis not present

## 2022-03-24 NOTE — Progress Notes (Signed)
  Subjective:  Patient ID: Evan Carter, male    DOB: 1970/03/13,  MRN: [id number]  Plantar fasciitis of left heel   52 y.o. male presents with the above complaint.  Patient presents to the office today for injection in the left heel.  He states that he gets an injection about every 6 months and helps with the pain.  He has a known heel spur.  He also does all the other conservative measures that have previously been recommended.   Review of Systems: Negative except as noted in the HPI. Denies N/V/F/Ch.   Objective:  There were no vitals filed for this visit. There is no height or weight on file to calculate BMI. Constitutional Well developed. Well nourished.  Vascular Dorsalis pedis pulses palpable bilaterally. Posterior tibial pulses palpable bilaterally. Capillary refill normal to all digits.  No cyanosis or clubbing noted. Pedal hair growth normal.  Neurologic Normal speech. Oriented to person, place, and time. Epicritic sensation to light touch grossly present bilaterally.  Dermatologic Nails well groomed and normal in appearance. No open wounds. No skin lesions.  Orthopedic: Normal joint ROM without pain or crepitus bilaterally. No visible deformities. Tender to palpation at the calcaneal tuber left. No pain with calcaneal squeeze left. Ankle ROM diminished range of motion left. Silfverskiold Test: negative left.   Radiographs: Taken and reviewed. No acute fractures or dislocations. No evidence of stress fracture.  Plantar heel spur present. Posterior heel spur absent.   Assessment:   1. Plantar fasciitis of left foot    Plan:  Patient was evaluated and treated and all questions answered.  Plantar Fasciitis, left - XR reviewed as above.  - Educated on icing and stretching. Instructions given.  - Injection delivered to the plantar fascia as below. - DME: Patient has night splint and other DME including orthotics - Pharmacologic management: NSAIDs as  needed  Procedure: Injection Tendon/Ligament Location: Left plantar fascia at the glabrous junction; medial approach. Skin Prep: alcohol Injectate: 1 cc 0.5% marcaine plain, 1 cc kenalog 10. Disposition: Patient tolerated procedure well. Injection site dressed with a band-aid.  Return in about 6 months (around 09/24/2022).

## 2022-07-27 ENCOUNTER — Ambulatory Visit (INDEPENDENT_AMBULATORY_CARE_PROVIDER_SITE_OTHER): Payer: PRIVATE HEALTH INSURANCE | Admitting: Podiatry

## 2022-07-27 DIAGNOSIS — M722 Plantar fascial fibromatosis: Secondary | ICD-10-CM

## 2022-07-27 NOTE — Progress Notes (Unsigned)
    Chief Complaint  Patient presents with   Foot Pain    Pt states he is here for an injection he states it started to flare up because he started to exercise more     HPI: 52 y.o. male presenting today with c/o pain in the bottom of the left heel.  Denies recent injury.  States that he has had this in the past and it just recently started flaring up again.  He works out on a regular basis and does not want to discontinue his exercise program.  He admits he does not always perform stretching exercises.  Past Medical History:  Diagnosis Date   Anxiety    Headache(784.0)    Hypertension    pcp   dr Jeanie Sewer   white [facility]    Past Surgical History:  Procedure Laterality Date   THYROID LOBECTOMY Right 11/07/2012   Dr Lazarus Salines   THYROIDECTOMY Right 11/07/2012   Procedure: RIGHT THYROID LOBECTOMY WITH FROZEN SECTION;  Surgeon: Flo Shanks, MD;  Location: [facility] OR;  Service: ENT;  Laterality: Right;    No Known Allergies   Physical Exam: General: The patient is alert and oriented x3 in no acute distress.  Dermatology:  No ecchymosis, erythema, or edema bilateral.  No open lesions.    Vascular: Palpable pedal pulses left foot.  Capillary refill within normal limits.  No appreciable edema.    Neurological: Light touch sensation intact bilateral.   Negative Tinel's sign with percussion of the posterior tibial nerve on the affected extremity.    Musculoskeletal Exam:  There is pain on palpation of the plantarmedial & plantarcentral aspect of left heel.  No gaps or nodules within the plantar fascia.  Positive Windlass mechanism bilateral.  Antalgic gait noted with first few steps upon standing.  No pain on palpation of achilles tendon bilateral.  Ankle df less than 10 degrees with knee extended b/l.  Assessment/Plan of Care: 1. Plantar fasciitis of left foot     -Reviewed etiology of plantar fasciitis with patient.  Discussed treatment options with patient today, including cortisone  injection, NSAID course of treatment, stretching exercises, physical therapy, use of night splint, rest, icing the heel, arch supports/orthotics, and supportive shoe gear.    With the patient's consent, a corticosteroid injection was administered to the plantar aspect of the right heel using a medial approach.  This consisted of a mixture of 1% lidocaine plain, 0.5% Sensorcaine plain, and Kenalog 10 for total of 1.25 cc administered.  A Band-Aid was applied.  He tolerated this well.  Return if symptoms worsen or fail to improve.   Clerance Lav, DPM, FACFAS [facility]     [address] [place], [place] [address]                Office [phone]  Fax [phone]

## 2022-07-30 DIAGNOSIS — M722 Plantar fascial fibromatosis: Secondary | ICD-10-CM | POA: Insufficient documentation

## 2022-07-30 MED ORDER — TRIAMCINOLONE ACETONIDE 10 MG/ML IJ SUSP
10.0000 mg | Freq: Once | INTRAMUSCULAR | Status: AC
  Administered 2022-07-30: 10 mg via INTRAMUSCULAR

## 2022-11-17 ENCOUNTER — Ambulatory Visit (INDEPENDENT_AMBULATORY_CARE_PROVIDER_SITE_OTHER): Payer: PRIVATE HEALTH INSURANCE | Admitting: Podiatry

## 2022-11-17 DIAGNOSIS — M722 Plantar fascial fibromatosis: Secondary | ICD-10-CM | POA: Diagnosis not present

## 2022-11-17 MED ORDER — TRIAMCINOLONE ACETONIDE 10 MG/ML IJ SUSP
10.0000 mg | Freq: Once | INTRAMUSCULAR | Status: AC
  Administered 2022-11-17: 10 mg via INTRAMUSCULAR

## 2022-11-17 NOTE — Progress Notes (Signed)
   HPI: 52 y.o. male presenting today with c/o pain in the bottom of the left heel.  Denies recent injury.  States that he has had this in the past and it just recently started flaring up again.    Past Medical History:  Diagnosis Date   Anxiety    Headache(784.0)    Hypertension    pcp   dr Jeanie Sewer   white [facility]    Past Surgical History:  Procedure Laterality Date   THYROID LOBECTOMY Right 11/07/2012   Dr Lazarus Salines   THYROIDECTOMY Right 11/07/2012   Procedure: RIGHT THYROID LOBECTOMY WITH FROZEN SECTION;  Surgeon: Flo Shanks, MD;  Location: [facility] OR;  Service: ENT;  Laterality: Right;    No Known Allergies   Physical Exam: General: The patient is alert and oriented x3 in no acute distress.  Dermatology:  No ecchymosis, erythema, or edema bilateral.  No open lesions.    Vascular: Palpable pedal pulses left foot.  Capillary refill within normal limits.  No appreciable edema.    Musculoskeletal Exam:  There is pain on palpation of the plantarmedial & plantarcentral aspect of left heel.  No gaps or nodules within the plantar fascia.  Positive Windlass mechanism bilateral.    Assessment/Plan of Care: 1. Plantar fasciitis of left foot     -Reviewed etiology of plantar fasciitis with patient.  Discussed treatment options with patient today, including cortisone injection, NSAID course of treatment, stretching exercises, physical therapy, use of night splint, rest, icing the heel, arch supports/orthotics, and supportive shoe gear.    With the patient's consent, a corticosteroid injection was administered to the plantar aspect of the right heel using a medial approach.  This consisted of a mixture of 1% lidocaine plain, 0.5% Sensorcaine plain, and Kenalog 10 for total of 1.25 cc administered.  A Band-Aid was applied.  He tolerated this well.  Return if symptoms worsen or fail to improve.   Clerance Lav, DPM, FACFAS [facility]     [address] [place], [place] [address]                Office [phone]  Fax [phone]

## 2023-05-17 ENCOUNTER — Ambulatory Visit (INDEPENDENT_AMBULATORY_CARE_PROVIDER_SITE_OTHER): Payer: PRIVATE HEALTH INSURANCE | Admitting: Podiatry

## 2023-05-17 DIAGNOSIS — M722 Plantar fascial fibromatosis: Secondary | ICD-10-CM

## 2023-05-17 NOTE — Progress Notes (Signed)
  Chief Complaint  Patient presents with   Injections    Left heel, PF. This is a recurrent problem and would like an injection today, it has been 6 months since his last one. Not diabetic and no anti coag.     HPI: 53 y.o. male presents today with mild return of his left plantar fasciitis.  He states that his last cortisone injection was approximately 6 months ago and he had been doing great until now.  He notes that the symptoms are only mild at this time, but he is planning on [place] hunting soon and does not want this flaring up affecting his hunting season.  He is requesting another cortisone injection today.  Past Medical History:  Diagnosis Date   Anxiety    Headache(784.0)    Hypertension    pcp   dr Clarnce Crow   white [facility]   Past Surgical History:  Procedure Laterality Date   THYROID  LOBECTOMY Right 11/07/2012   Dr Archer Kobs   THYROIDECTOMY Right 11/07/2012   Procedure: RIGHT THYROID  LOBECTOMY WITH FROZEN SECTION;  Surgeon: Lenton Rail, MD;  Location: [facility] OR;  Service: ENT;  Laterality: Right;   No Known Allergies   Physical Exam: Palpable pedal pulses noted.  No open lesions noted.  Pain on palpation to the plantar medial plantar central portions of the left heel.  No gaps or nodules noted within the Achilles tendon or the plantar fascia.  Negative Tinel's sign with the posterior tibial nerve.  Epicritic sensation is intact  Assessment/Plan of Care: 1. Plantar fasciitis of left foot     With the patient's verbal consent, a corticosteroid injection was adminstered to the left plantar heel.  This consisted of a mixture of 1% lidocaine  plain, 0.5% sensorcaine plain, and Kenalog -10 for a total of 1.25cc's administered.  Bandaid applied. Patient tolerated this well.   He will wait 2 days before resuming any stretching exercises.  Follow-up as needed   Joe Murders, DPM, FACFAS [facility]     [address] [place], [place] [address]                Office [phone]  Fax [phone]

## 2023-11-23 ENCOUNTER — Ambulatory Visit (INDEPENDENT_AMBULATORY_CARE_PROVIDER_SITE_OTHER): Payer: PRIVATE HEALTH INSURANCE | Admitting: Podiatry

## 2023-11-23 DIAGNOSIS — M722 Plantar fascial fibromatosis: Secondary | ICD-10-CM | POA: Diagnosis not present

## 2023-11-23 NOTE — Progress Notes (Signed)
  Chief Complaint  Patient presents with   Foot Pain    L heel pain is coming back. Painful when stepping out of car.  Would like injection.  Not diabetic. no anti coag    HPI: 53 y.o. male presents today requesting an injection for his left plantar fasciitis.  States he is just starting to feel the symptoms come back.  He wants to get ahead of it before the holiday season.  Past Medical History:  Diagnosis Date   Anxiety    Headache(784.0)    Hypertension    pcp   dr Dottie   [facility]   Past Surgical History:  Procedure Laterality Date   THYROID  LOBECTOMY Right 11/07/2012   Dr ARLANA   THYROIDECTOMY Right 11/07/2012   Procedure: RIGHT THYROID  LOBECTOMY WITH FROZEN SECTION;  Surgeon: Marlyce Arlana, MD;  Location: [facility] OR;  Service: ENT;  Laterality: Right;   No Known Allergies    Physical Exam: Palpable pedal pulses.  No ecchymosis erythema or edema.  This pain on palpation to the plantar medial aspect of the left heel.  No gaps or nodules within the plantar fascia..  Assessment/Plan of Care: 1. Plantar fasciitis of left foot     With the patient's verbal consent, a corticosteroid injection was adminstered to the plantar left heel using a medial approach.  This consisted of a mixture of 1% lidocaine  plain, 0.5% sensorcaine plain, and Kenalog -10 for a total of 1.25cc's administered.  Bandaid applied. Patient tolerated this well.     He will follow-up as needed per his preference   Awanda CHARM Imperial, DPM, FACFAS [facility]     [address] [place], [place] [address]                Office [phone]  Fax [phone]
